# Patient Record
Sex: Male | Born: 1960 | Race: White | Hispanic: No | Marital: Married | Smoking: Never smoker
Health system: Southern US, Community
[De-identification: ages and names within clinical notes are randomized; demographics above are authoritative.]

## PROBLEM LIST (undated history)

## (undated) DIAGNOSIS — H02409 Unspecified ptosis of unspecified eyelid: Secondary | ICD-10-CM

## (undated) DIAGNOSIS — H532 Diplopia: Secondary | ICD-10-CM

## (undated) DIAGNOSIS — R519 Headache, unspecified: Secondary | ICD-10-CM

## (undated) DIAGNOSIS — E669 Obesity, unspecified: Secondary | ICD-10-CM

## (undated) DIAGNOSIS — E785 Hyperlipidemia, unspecified: Secondary | ICD-10-CM

## (undated) DIAGNOSIS — R51 Headache: Secondary | ICD-10-CM

## (undated) DIAGNOSIS — Z789 Other specified health status: Secondary | ICD-10-CM

## (undated) HISTORY — DX: Unspecified ptosis of unspecified eyelid: H02.409

## (undated) HISTORY — DX: Obesity, unspecified: E66.9

## (undated) HISTORY — DX: Diplopia: H53.2

## (undated) HISTORY — PX: KNEE SURGERY: SHX244

## (undated) HISTORY — DX: Hyperlipidemia, unspecified: E78.5

---

## 2018-06-29 ENCOUNTER — Ambulatory Visit: Payer: Commercial Managed Care - PPO | Admitting: Neurology

## 2018-06-29 ENCOUNTER — Encounter: Payer: Self-pay | Admitting: *Deleted

## 2018-06-29 ENCOUNTER — Encounter: Payer: Self-pay | Admitting: Neurology

## 2018-06-29 ENCOUNTER — Telehealth: Payer: Self-pay | Admitting: Neurology

## 2018-06-29 VITALS — BP 123/74 | HR 97 | Ht 71.0 in | Wt 316.0 lb

## 2018-06-29 DIAGNOSIS — H02402 Unspecified ptosis of left eyelid: Secondary | ICD-10-CM | POA: Diagnosis not present

## 2018-06-29 DIAGNOSIS — M6281 Muscle weakness (generalized): Secondary | ICD-10-CM | POA: Diagnosis not present

## 2018-06-29 DIAGNOSIS — H532 Diplopia: Secondary | ICD-10-CM

## 2018-06-29 MED ORDER — SUMATRIPTAN SUCCINATE 50 MG PO TABS: 50.0000 mg | ORAL_TABLET | ORAL | 6 refills | Status: DC | PRN

## 2018-06-29 MED ORDER — PYRIDOSTIGMINE BROMIDE 60 MG PO TABS: 60.0000 mg | ORAL_TABLET | Freq: Three times a day (TID) | ORAL | 6 refills | Status: DC

## 2018-06-29 NOTE — Progress Notes (Signed)
PATIENT: Scott Solis DOB: [birth year]  Chief Complaint  Patient presents with  . Left eye ptosis/diplopia    Reports left eye ptosis and diplopia for the last two weeks.  Marland Kitchen PCP    Noni Saupe, MD     HISTORICAL  Scott Solis is a 57 year old male, seen in request by his primary care physician Dr. Gwendlyn Deutscher for evaluation of left ptosis, binocular double vision, initial evaluation was on June 29, 2018.  I have reviewed and summarized the referring note from the referring physician.  He had past medical history of obesity, occasionally headache in the past, in April 2019, he began to have headache involving left eye inner corner, shortly afterwards, he began to noticed left ptosis, which has been persistent since, in the beginning, ptosis varies, lasting, worse in the late evening, sometimes to the point of locking left vision.  I personally reviewed CT head without contrast on May 14, MRI of the brain June 23 2018 was normal.  Sinus was clear  In August 2019, in addition to persistent variable degree of left ptosis, he developed binocular double vision, initially it was intermittent, difficulty focusing, lasting for 15 minutes, since October 2019, his double vision become persistent, difficulty moving his left eye to left side.    He was initially treated by ENT, there was no significant abnormality found, was seen by ophthalmologist,I reviewed evaluation by [facility] on June 23, 2018 by Dr.Jennifer Abelina Bachelor, iMovie muscle movement consistent with cranial 6 palsy, esotropia of left eye,   Laboratory evaluations in October 2019, normal CBC hemoglobin of 15.9, CMP, creatinine of 0.9, B12 412, TSH 2.5, LDL 129 A1c 6.1, negative hepatitis C,  REVIEW OF SYSTEMS: Full 14 system review of systems performed and notable only for as above All other review of systems were negative.  ALLERGIES: No Known Allergies  HOME MEDICATIONS: Current Outpatient  Medications  Medication Sig Dispense Refill  . Multiple Vitamin (MULTIVITAMIN) capsule Take 1 capsule by mouth daily.     No current facility-administered medications for this visit.     PAST MEDICAL HISTORY: Past Medical History:  Diagnosis Date  . Diplopia   . Dyslipidemia   . Obesity   . Ptosis    left    PAST SURGICAL HISTORY: Past Surgical History:  Procedure Laterality Date  . KNEE SURGERY Right     FAMILY HISTORY: Family History  Problem Relation Age of Onset  . Healthy Mother   . CAD Father   . Healthy Brother     SOCIAL HISTORY: Social History   Socioeconomic History  . Marital status: Married    Spouse name: Not on file  . Number of children: 2  . Years of education: college  . Highest education level: Bachelor's degree (e.g., BA, AB, BS)  Occupational History  . Occupation: IT  Social Needs  . Financial resource strain: Not on file  . Food insecurity:    Worry: Not on file    Inability: Not on file  . Transportation needs:    Medical: Not on file    Non-medical: Not on file  Tobacco Use  . Smoking status: Never Smoker  . Smokeless tobacco: Never Used  Substance and Sexual Activity  . Alcohol use: Yes    Comment: rare  . Drug use: Never  . Sexual activity: Not on file  Lifestyle  . Physical activity:    Days per week: Not on file    Minutes per session: Not  on file  . Stress: Not on file  Relationships  . Social connections:    Talks on phone: Not on file    Gets together: Not on file    Attends religious service: Not on file    Active member of club or organization: Not on file    Attends meetings of clubs or organizations: Not on file    Relationship status: Not on file  . Intimate partner violence:    Fear of current or ex partner: Not on file    Emotionally abused: Not on file    Physically abused: Not on file    Forced sexual activity: Not on file  Other Topics Concern  . Not on file  Social History Narrative   Lives at  home with his wife.   Right-handed.   No daily caffeine use.     PHYSICAL EXAM   Vitals:   06/29/18 0758  BP: 123/74  Pulse: 97  Weight: (!) 316 lb (143.3 kg)  Height: 5\' 11"  (1.803 m)    Not recorded      Body mass index is 44.07 kg/m.  PHYSICAL EXAMNIATION:  Gen: NAD, conversant, well nourised, obese, well groomed                     Cardiovascular: Regular rate rhythm, no peripheral edema, warm, nontender. Eyes: Conjunctivae clear without exudates or hemorrhage Neck: Supple, no carotid bruits. Pulmonary: Clear to auscultation bilaterally   NEUROLOGICAL EXAM:  MENTAL STATUS: Speech:    Speech is normal; fluent and spontaneous with normal comprehension.  Cognition:     Orientation to time, place and person     Normal recent and remote memory     Normal Attention span and concentration     Normal Language, naming, repeating,spontaneous speech     Fund of knowledge   CRANIAL NERVES: CN II: Visual fields are full to confrontation. Fundoscopic exam is normal with sharp discs and no vascular changes. Pupils are round equal and briskly reactive to light. CN III, IV, VI: fatigable left ptosis, left lateral rectus muscle weakness, bilateral esophoria,   CN V: Facial sensation is intact to pinprick in all 3 divisions bilaterally. Corneal responses are intact.  CN VII: Face is symmetric with normal eye closure and smile. No significant bulbar weakness noticed. CN VIII: Hearing is normal to rubbing fingers CN IX, X: Palate elevates symmetrically. Phonation is normal. CN XI: Head turning and shoulder shrug are intact CN XII: Tongue is midline with normal movements and no atrophy.  MOTOR: Mild bilateral shoulder abduction weakness.  REFLEXES: Reflexes are 2+ and symmetric at the biceps, triceps, knees, and ankles. Plantar responses are flexor.  SENSORY: Intact to light touch, pinprick, positional sensation and vibratory sensation are intact in fingers and  toes.  COORDINATION: Rapid alternating movements and fine finger movements are intact. There is no dysmetria on finger-to-nose and heel-knee-shin.    GAIT/STANCE: Able to get up arm crossed. Posture is normal. Gait is steady with normal steps, base, arm swing, and turning. Heel and toe walking are normal. Tandem gait is normal.  Romberg is absent.   DIAGNOSTIC DATA (LABS, IMAGING, TESTING) - I reviewed patient records, labs, notes, testing and imaging myself where available.   ASSESSMENT AND PLAN  Scott Solis is a 57 y.o. male   Left ptosis, binocular double vision,  Most consistent with neuromuscular junctional disorder, myasthenia gravis,  The atypical features is consistent left eye involvement only, persistent left lateral rectus  muscle weakness,  MRA of brain to rule out aneurysm  Myasthenia gravis panel  Mestinon 60 mg 3 times a day  CT of chest to rule out sinus pathology  Headache with migraine features  Imitrex 50 mg as needed for headaches   Levert Feinstein, M.D. Ph.D.  [facility] [address], [address] [place], [place] [address] Ph: [phone] Fax: [phone]  CC: Referring Provider

## 2018-06-29 NOTE — Patient Instructions (Signed)
Myasthenia Gravis  Myasthenia gravis (MG) means severe weakness. It is a long-term (chronic) condition that causes weakness in the muscles you can control (voluntary muscles). MG can affect any voluntary muscle. The muscles most often affected are the ones that control:   Eye movement.   Facial movements.   Swallowing.    MG is an autoimmune disease, which means that your body's defense system (immune system) attacks healthy parts of your body instead of germs and other things that make you sick. When you have MG, your immune system makes proteins (antibodies) that block the chemical (acetylcholine) your body needs to send nerve signals to your muscles. This causes muscle weakness.  What are the causes?  The exact cause of MG is unknown. One possible cause is an enlarged thymus gland, which is located under your breastbone.  What are the signs or symptoms?  The earliest symptom of MG is muscle weakness that gets worse with activity and gets better after rest. Other symptoms of MG may include:   Drooping eyelids.   Double vision.   Loss of facial expression.   Trouble chewing and swallowing.   Slurred speech.   A waddling walk.   Weakness of the arms, hands, and legs.    Trouble breathing is the most dangerous symptom of MG. Sudden and severe difficulty breathing (myasthenic crisis) may require emergency breathing support. This symptom sometimes happens after:   Infection.   Fever.   Drug reaction.    How is this diagnosed?  It can be hard to diagnose MG because muscle weakness is a common symptom in many conditions. Your health care provider will do a physical exam. You may also have tests that will help make a diagnosis. These may include:   A blood test.   A test using the medicine edrophonium. This medicine increases muscle strength by slowing the breakdown of acetylcholine.   Tests to measure nerve conduction to muscle (electromyography).   An imaging study of the chest (CT or MRI).    How is  this treated?  Treatment can improve muscle strength. Sometimes symptoms of MG go away for a while (remission) and you can stop treatment. Possible treatments include:   Medicine.   Removal of the thymus gland (thymectomy). This may result in a long remission for some people.    Follow these instructions at home:   Take medicines only as directed by your health care provider.   Get plenty of rest to conserve your energy.   Take frequent breaks to rest your eyes.   Maintain a healthy diet and a healthy weight.   Do not use any tobacco products including cigarettes, chewing tobacco, or electronic cigarettes. If you need help quitting, ask your health care provider.   Keep all follow-up visits as directed by your health care provider. This is important.  Contact a health care provider if:   Your symptoms get worse after a fever or infection.   You have a reaction to a medicine you are taking.   Your symptoms change or get worse.  Get help right away if:  You have trouble breathing.  This information is not intended to replace advice given to you by your health care provider. Make sure you discuss any questions you have with your health care provider.  Document Released: 12/01/2000 Document Revised: 01/31/2016 Document Reviewed: 10/26/2013  Elsevier Interactive Patient Education  2018 Elsevier Inc.

## 2018-06-29 NOTE — Telephone Encounter (Signed)
[facility] Auth: NPR Ref # [id number] lvm for pt to call me back to see if he would like to go to [place].

## 2018-06-30 NOTE — Telephone Encounter (Signed)
Patient is schedule to have his CT at the out pt center for 07/05/18 arrival time is 1:45 pm once he is done w/that to go to the MRI center at 2:45 pm to have the MRA. Patient is aware of all of this. Faxed orders.

## 2018-07-02 ENCOUNTER — Encounter: Payer: Self-pay | Admitting: Neurology

## 2018-07-05 ENCOUNTER — Encounter: Payer: Self-pay | Admitting: Neurology

## 2018-07-07 ENCOUNTER — Telehealth: Payer: Self-pay | Admitting: Neurology

## 2018-07-07 DIAGNOSIS — I671 Cerebral aneurysm, nonruptured: Secondary | ICD-10-CM

## 2018-07-07 LAB — ACETYLCHOLINE RECEPTOR AB, ALL: ACETYLCHOL BLOCK AB: 20 % (ref 0–25)

## 2018-07-07 LAB — HEMOGLOBIN A1C
ESTIMATED AVERAGE GLUCOSE: 114 mg/dL
HEMOGLOBIN A1C: 5.6 % (ref 4.8–5.6)

## 2018-07-07 NOTE — Addendum Note (Signed)
Addended by: Levert Feinstein on: 07/07/2018 10:07 AM   Modules accepted: Orders

## 2018-07-07 NOTE — Telephone Encounter (Signed)
Sent via Fax [phone] .  Patient is being seen today but Dr. Conchita Paris arrive at 12:15 for a 12:30 apt. I have talked to patient about details and he will be there. Thanks Annabelle Harman.

## 2018-07-07 NOTE — Telephone Encounter (Addendum)
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  He continue complains of left-sided headaches, ptosis, double vision  I have suggested him to stop taking Mestinon, Imitrex, avoiding antiplatelet, NSAIDs,  I have discussed his case with neurosurgeon Dr. Conchita Paris, will see patient urgently this afternoon.

## 2018-07-12 ENCOUNTER — Other Ambulatory Visit: Payer: Self-pay | Admitting: Neurosurgery

## 2018-07-12 DIAGNOSIS — I671 Cerebral aneurysm, nonruptured: Secondary | ICD-10-CM

## 2018-07-22 ENCOUNTER — Other Ambulatory Visit: Payer: Self-pay | Admitting: Neurosurgery

## 2018-07-26 NOTE — H&P (Signed)
Chief Complaint   Carotid cavernous fistula  HPI   HPI Scott Solis is a 57 y.o. male with a primary complaint of left-sided retro-orbital pain, eyelid drooping, and blurry/double vision.    Symptoms started several months ago initially with just retro-orbital pain.  Eventually   Symptoms progressed and he noticed drooping of his eyelid, double vision and the inability to look to the left with his left eye.   History of chronic tinnitus. 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  He presents today for diagnostic cerebral angiogram.  He is without any concerns.   Patient Active Problem List   Diagnosis Date Noted  . 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 Diplopia 06/29/2018    PMH: Past Medical History:  Diagnosis Date  . Diplopia   . Dyslipidemia   . Obesity   . Ptosis    left    PSH: Past Surgical History:  Procedure Laterality Date  . KNEE SURGERY Right      (Not in a hospital admission)  SH: Social History   Tobacco Use  . Smoking status: Never Smoker  . Smokeless tobacco: Never Used  Substance Use Topics  . Alcohol use: Yes    Comment: rare  . Drug use: Never    MEDS: Prior to Admission medications   Medication Sig Start Date End Date Taking? Authorizing Provider  CALCIUM-MAGNESIUM PO Take 1 tablet by mouth daily.   Yes [provider]  Multiple Vitamin (MULTIVITAMIN) capsule Take 3 capsules by mouth daily.    Yes [provider]  SUMAtriptan (IMITREX) 50 MG tablet Take 1 tablet (50 mg total) by mouth every 2 (two) hours as needed for migraine. May repeat in 2 hours if headache persists or recurs. 06/29/18  Yes Levert Feinstein, MD  pyridostigmine (MESTINON) 60 MG tablet Take 1 tablet (60 mg total) by mouth 3 (three) times daily. Patient not taking: Reported on 07/21/2018 06/29/18   Levert Feinstein, MD    ALLERGY: No Known Allergies  Social History   Tobacco Use  . Smoking status: Never Smoker  . Smokeless  tobacco: Never Used  Substance Use Topics  . Alcohol use: Yes    Comment: rare     Family History  Problem Relation Age of Onset  . Healthy Mother   . CAD Father   . Healthy Brother      ROS   ROS  Exam   There were no vitals filed for this visit. General appearance: WDWN, NAD Eyes: No scleral injection Cardiovascular: Regular rate and rhythm without murmurs, rubs, gallops. No edema or variciosities. Distal pulses normal. Pulmonary: Effort normal, non-labored breathing Musculoskeletal:     Muscle tone upper extremities: Normal    Muscle tone lower extremities: Normal    Motor exam: Upper Extremities Deltoid Bicep Tricep Grip  Right 5/5 5/5 5/5 5/5  Left 5/5 5/5 5/5 5/5   Lower Extremity IP Quad PF DF EHL  Right 5/5 5/5 5/5 5/5 5/5  Left 5/5 5/5 5/5 5/5 5/5   Neurological Mental Status:    - Patient is awake, alert, oriented to person, place, month, year, and situation    - Patient is able to give a clear and coherent history.    - No signs of aphasia or neglect Cranial Nerves    - II: Visual Fields are full. PERRL    - III/IV/VI:  Bilateral partial left ptosis.  Left abducens palsy    - V: Facial sensation is  grossly normal    - VII: Facial movement is symmetric.     - VIII: hearing is intact to voice    - X: Uvula elevates symmetrically    - XI: Shoulder shrug is symmetric.    - XII: tongue is midline without atrophy or fasciculations.  Sensory: Sensation grossly intact to LT Deep Tendon Reflexes    - 2+ and symmetric in the biceps and patellae.  Plantars   - Toes are downgoing bilaterally.  Cerebellar    - FNF and HKS are intact bilaterally   Results - Imaging/Labs   No results found for this or any previous visit (from the past 48 hour(s)).  No results found.  IMAGING: MRA of the brain with and without contrast dated 07/02/2018 was reviewed.  This demonstrates significant flow related hyperintensity within the left cavernous sinus, with significant  enlargement and hyperintensity of the superficial left middle cerebral vein.  Of note, no significantly dilated superior ophthalmic vein on the left.  Impression/Plan   57 y.o. 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  Etiology remains unclear.  We will proceed with diagnostic cerebral angiogram for further workup in characterization of the carotid cavernous fistula.  While in the office risks, benefits and alternatives to the procedure were discussed.  Patient and his wife understood discussion and willing to proceed.  All questions were answered.

## 2018-07-27 ENCOUNTER — Encounter (HOSPITAL_COMMUNITY): Payer: Self-pay

## 2018-07-27 ENCOUNTER — Ambulatory Visit (HOSPITAL_COMMUNITY)
Admission: RE | Admit: 2018-07-27 | Discharge: 2018-07-27 | Disposition: A | Discharge status: Home / Self Care | Payer: Commercial Managed Care - PPO | Source: Ambulatory Visit | Attending: Neurosurgery | Admitting: Neurosurgery

## 2018-07-27 ENCOUNTER — Other Ambulatory Visit: Payer: Self-pay | Admitting: Neurosurgery

## 2018-07-27 DIAGNOSIS — E669 Obesity, unspecified: Secondary | ICD-10-CM | POA: Diagnosis not present

## 2018-07-27 DIAGNOSIS — I671 Cerebral aneurysm, nonruptured: Secondary | ICD-10-CM

## 2018-07-27 DIAGNOSIS — H0520 Unspecified exophthalmos: Secondary | ICD-10-CM | POA: Diagnosis not present

## 2018-07-27 DIAGNOSIS — H532 Diplopia: Secondary | ICD-10-CM | POA: Diagnosis not present

## 2018-07-27 DIAGNOSIS — E785 Hyperlipidemia, unspecified: Secondary | ICD-10-CM | POA: Insufficient documentation

## 2018-07-27 DIAGNOSIS — H02402 Unspecified ptosis of left eyelid: Secondary | ICD-10-CM | POA: Diagnosis not present

## 2018-07-27 HISTORY — PX: IR ANGIO INTRA EXTRACRAN SEL INTERNAL CAROTID BILAT MOD SED: IMG5363

## 2018-07-27 HISTORY — PX: IR ANGIO EXTERNAL CAROTID SEL EXT CAROTID BILAT MOD SED: IMG5372

## 2018-07-27 HISTORY — PX: IR ANGIO VERTEBRAL SEL VERTEBRAL UNI R MOD SED: IMG5368

## 2018-07-27 HISTORY — DX: Other specified health status: Z78.9

## 2018-07-27 LAB — CBC WITH DIFFERENTIAL/PLATELET
Abs Immature Granulocytes: 0.06 10*3/uL (ref 0.00–0.07)
BASOS ABS: 0 10*3/uL (ref 0.0–0.1)
Basophils Relative: 0 %
EOS PCT: 3 %
Eosinophils Absolute: 0.2 10*3/uL (ref 0.0–0.5)
HEMATOCRIT: 50.5 % (ref 39.0–52.0)
HEMOGLOBIN: 15.7 g/dL (ref 13.0–17.0)
Immature Granulocytes: 1 %
LYMPHS PCT: 22 %
Lymphs Abs: 1.1 10*3/uL (ref 0.7–4.0)
MCH: 28.3 pg (ref 26.0–34.0)
MCHC: 31.1 g/dL (ref 30.0–36.0)
MCV: 91.2 fL (ref 80.0–100.0)
Monocytes Absolute: 0.5 10*3/uL (ref 0.1–1.0)
Monocytes Relative: 11 %
NRBC: 0 % (ref 0.0–0.2)
Neutro Abs: 3.2 10*3/uL (ref 1.7–7.7)
Neutrophils Relative %: 63 %
Platelets: 163 10*3/uL (ref 150–400)
RBC: 5.54 MIL/uL (ref 4.22–5.81)
RDW: 12.9 % (ref 11.5–15.5)
WBC: 5.1 10*3/uL (ref 4.0–10.5)

## 2018-07-27 LAB — BASIC METABOLIC PANEL
Anion gap: 9 (ref 5–15)
BUN: 12 mg/dL (ref 6–20)
CALCIUM: 9 mg/dL (ref 8.9–10.3)
CO2: 26 mmol/L (ref 22–32)
CREATININE: 0.94 mg/dL (ref 0.61–1.24)
Chloride: 105 mmol/L (ref 98–111)
GFR calc Af Amer: >60 mL/min (ref >60)
GFR calc non Af Amer: >60 mL/min (ref >60)
Glucose, Bld: 98 mg/dL (ref 70–99)
Potassium: 3.9 mmol/L (ref 3.5–5.1)
SODIUM: 140 mmol/L (ref 135–145)

## 2018-07-27 LAB — APTT: APTT: 28 s (ref 24–36)

## 2018-07-27 LAB — PROTIME-INR
INR: 1
PROTHROMBIN TIME: 13.1 s (ref 11.4–15.2)

## 2018-07-27 MED ORDER — IOPAMIDOL (ISOVUE-300) INJECTION 61%
INTRAVENOUS | Status: AC
  Filled 2018-07-27: qty 100

## 2018-07-27 MED ORDER — MIDAZOLAM HCL 2 MG/2ML IJ SOLN
INTRAMUSCULAR | Status: AC
  Filled 2018-07-27: qty 2

## 2018-07-27 MED ORDER — HEPARIN SODIUM (PORCINE) 1000 UNIT/ML IJ SOLN
INTRAMUSCULAR | Status: AC | PRN
  Administered 2018-07-27: 2000 [IU] via INTRAVENOUS

## 2018-07-27 MED ORDER — HYDROCODONE-ACETAMINOPHEN 5-325 MG PO TABS: 1.0000 | ORAL_TABLET | ORAL | Status: DC | PRN

## 2018-07-27 MED ORDER — MIDAZOLAM HCL 2 MG/2ML IJ SOLN
INTRAMUSCULAR | Status: AC | PRN
  Administered 2018-07-27: 1 mg via INTRAVENOUS

## 2018-07-27 MED ORDER — LIDOCAINE HCL 1 % IJ SOLN
INTRAMUSCULAR | Status: AC
  Filled 2018-07-27: qty 20

## 2018-07-27 MED ORDER — LIDOCAINE HCL (PF) 1 % IJ SOLN
INTRAMUSCULAR | Status: AC | PRN
  Administered 2018-07-27: 10 mL

## 2018-07-27 MED ORDER — SODIUM CHLORIDE 0.9 % IV SOLN: INTRAVENOUS | Status: DC

## 2018-07-27 MED ORDER — FENTANYL CITRATE (PF) 100 MCG/2ML IJ SOLN
INTRAMUSCULAR | Status: AC
  Filled 2018-07-27: qty 2

## 2018-07-27 MED ORDER — IOPAMIDOL (ISOVUE-300) INJECTION 61%
INTRAVENOUS | Status: AC
  Filled 2018-07-27: qty 50

## 2018-07-27 MED ORDER — HEPARIN SODIUM (PORCINE) 1000 UNIT/ML IJ SOLN
INTRAMUSCULAR | Status: AC
  Filled 2018-07-27: qty 1

## 2018-07-27 MED ORDER — FENTANYL CITRATE (PF) 100 MCG/2ML IJ SOLN
INTRAMUSCULAR | Status: AC | PRN
  Administered 2018-07-27: 25 ug via INTRAVENOUS

## 2018-07-27 NOTE — Discharge Instructions (Signed)
Cerebral Angiogram, Care After °Refer to this sheet in the next few weeks. These instructions provide you with information on caring for yourself after your procedure. Your health care provider may also give you more specific instructions. Your treatment has been planned according to current medical practices, but problems sometimes occur. Call your health care provider if you have any problems or questions after your procedure. °What can I expect after the procedure? °After your procedure, it is typical to have the following: °· Bruising at the catheter insertion site that usually fades within 1-2 weeks. °· Blood collecting in the tissue (hematoma) that may be painful to the touch. It should usually decrease in size and tenderness within 1-2 weeks. °· A mild headache. ° °Follow these instructions at home: °· Take medicines only as directed by your health care provider. °· You may shower 24-48 hours after the procedure or as directed by your health care provider. Remove the bandage (dressing) and gently wash the site with plain soap and water. Pat the area dry with a clean towel. Do not rub the site, because this may cause bleeding. °· Do not take baths, swim, or use a hot tub until your health care provider approves. °· Check your insertion site every day for redness, swelling, or drainage. °· Do not apply powder or lotion to the site. °· Do not lift over 10 lb (4.5 kg) for 5 days after your procedure or as directed by your health care provider. °· Ask your health care provider when it is okay to: °? Return to work or school. °? Resume usual physical activities or sports. °? Resume sexual activity. °· Do not drive home if you are discharged the same day as the procedure. Have someone else drive you. °· You may drive 24 hours after the procedure unless otherwise instructed by your health care provider. °· Do not operate machinery or power tools for 24 hours after the procedure or as directed by your health care  provider. °· If your procedure was done as an outpatient procedure, which means that you went home the same day as your procedure, a responsible adult should be with you for the first 24 hours after you arrive home. °· Keep all follow-up visits as directed by your health care provider. This is important. °Contact a health care provider if: °· You have a fever. °· You have chills. °· You have increased bleeding from the catheter insertion site. Hold pressure on the site. CALL 911 °Get help right away if: °· You have vision changes or loss of vision. °· You have numbness or weakness on one side of your body. °· You have difficulty talking, or you have slurred speech or cannot speak (aphasia). °· You feel confused or have difficulty remembering. °· You have unusual pain at the catheter insertion site. °· You have redness, warmth, or swelling at the catheter insertion site. °· You have drainage (other than a small amount of blood on the dressing) from the catheter insertion site. °· The catheter insertion site is bleeding, and the bleeding does not stop after 30 minutes of holding steady pressure on the site. °These symptoms may represent a serious problem that is an emergency. Do not wait to see if the symptoms will go away. Get medical help right away. Call your local emergency services (911 in [place] Do not drive yourself to the hospital. °This information is not intended to replace advice given to you by your health care provider. Make sure you discuss   any questions you have with your health care provider. °Document Released: 01/09/2014 Document Revised: 01/31/2016 Document Reviewed: 09/07/2013 °Elsevier Interactive Patient Education © 2017 [employer] Inc. °Moderate Conscious Sedation, Adult, Care After °These instructions provide you with information about caring for yourself after your procedure. Your health care provider may also give you more specific instructions. Your treatment has been planned according to  current medical practices, but problems sometimes occur. Call your health care provider if you have any problems or questions after your procedure. °What can I expect after the procedure? °After your procedure, it is common: °· To feel sleepy for several hours. °· To feel clumsy and have poor balance for several hours. °· To have poor judgment for several hours. °· To vomit if you eat too soon. ° °Follow these instructions at home: °For at least 24 hours after the procedure: ° °· Do not: °? Participate in activities where you could fall or become injured. °? Drive. °? Use heavy machinery. °? Drink alcohol. °? Take sleeping pills or medicines that cause drowsiness. °? Make important decisions or sign legal documents. °? Take care of children on your own. °· Rest. °Eating and drinking °· Follow the diet recommended by your health care provider. °· If you vomit: °? Drink water, juice, or soup when you can drink without vomiting. °? Make sure you have little or no nausea before eating solid foods. °General instructions °· Have a responsible adult stay with you until you are awake and alert. °· Take over-the-counter and prescription medicines only as told by your health care provider. °· If you smoke, do not smoke without supervision. °· Keep all follow-up visits as told by your health care provider. This is important. °Contact a health care provider if: °· You keep feeling nauseous or you keep vomiting. °· You feel light-headed. °· You develop a rash. °· You have a fever. °Get help right away if: °· You have trouble breathing. °This information is not intended to replace advice given to you by your health care provider. Make sure you discuss any questions you have with your health care provider. °Document Released: 06/15/2013 Document Revised: 01/28/2016 Document Reviewed: 12/15/2015 °Elsevier Interactive Patient Education © 2018 Elsevier Inc. ° °

## 2018-07-27 NOTE — Sedation Documentation (Signed)
Right groin arterial sheath removed. 775fr Exoseal closure device used, manual pressure being held at right groin site.

## 2018-07-29 ENCOUNTER — Ambulatory Visit: Payer: Commercial Managed Care - PPO | Admitting: Neurology

## 2018-08-03 ENCOUNTER — Encounter (HOSPITAL_COMMUNITY): Payer: Self-pay | Admitting: Neurosurgery

## 2018-08-10 ENCOUNTER — Other Ambulatory Visit (HOSPITAL_COMMUNITY): Payer: Self-pay | Admitting: Interventional Radiology

## 2018-08-10 ENCOUNTER — Other Ambulatory Visit (HOSPITAL_COMMUNITY): Payer: Self-pay | Admitting: Neurosurgery

## 2018-08-10 ENCOUNTER — Other Ambulatory Visit: Payer: Self-pay | Admitting: Neurosurgery

## 2018-08-10 DIAGNOSIS — I671 Cerebral aneurysm, nonruptured: Secondary | ICD-10-CM

## 2018-08-17 NOTE — Pre-Procedure Instructions (Signed)
Scott Solis  08/17/2018      [id number] Rosalita Levan, [place] - [address] [place] [address] Phone: [phone] Fax: [phone]    Your procedure is scheduled on Friday December 13th.  Report to [facility] Admitting at 9:30 A.M.  Call this number if you have problems the morning of surgery:  [phone]   Remember:  Do not eat or drink after midnight.     Take these medicines the morning of surgery with A SIP OF WATER - NONE    7 days prior to surgery STOP taking any Aspirin(unless otherwise instructed by your surgeon), Aleve, Naproxen, Ibuprofen, Motrin, Advil, Goody's, BC's, all herbal medications, fish oil, and all vitamins   Do not wear jewelry.  Do not wear lotions, powders, or colognes, or deodorant.  Do not shave 48 hours prior to surgery.  Men may shave face and neck.  Do not bring valuables to the hospital.  [facility] is not responsible for any belongings or valuables.  Contacts, eyeglasses, hearing aids, dentures or bridgework may not be worn into surgery.  Leave your suitcase in the car.  After surgery it may be brought to your room.  For patients admitted to the hospital, discharge time will be determined by your treatment team.  Patients discharged the day of surgery will not be allowed to drive home.   [facility]- Preparing For Surgery  Before surgery, you can play an important role. Because skin is not sterile, your skin needs to be as free of germs as possible. You can reduce the number of germs on your skin by washing with CHG (chlorahexidine gluconate) Soap before surgery.  CHG is an antiseptic cleaner which kills germs and bonds with the skin to continue killing germs even after washing.    Oral Hygiene is also important to reduce your risk of infection.  Remember - BRUSH YOUR TEETH THE MORNING OF SURGERY WITH YOUR REGULAR TOOTHPASTE  Please do not use if you have an allergy to CHG or  antibacterial soaps. If your skin becomes reddened/irritated stop using the CHG.  Do not shave (including legs and underarms) for at least 48 hours prior to first CHG shower. It is OK to shave your face.  Please follow these instructions carefully.   1. Shower the NIGHT BEFORE SURGERY and the MORNING OF SURGERY with CHG.   2. If you chose to wash your hair, wash your hair first as usual with your normal shampoo.  3. After you shampoo, rinse your hair and body thoroughly to remove the shampoo.  4. Use CHG as you would any other liquid soap. You can apply CHG directly to the skin and wash gently with a scrungie or a clean washcloth.   5. Apply the CHG Soap to your body ONLY FROM THE NECK DOWN.  Do not use on open wounds or open sores. Avoid contact with your eyes, ears, mouth and genitals (private parts). Wash Face and genitals (private parts)  with your normal soap.  6. Wash thoroughly, paying special attention to the area where your surgery will be performed.  7. Thoroughly rinse your body with warm water from the neck down.  8. DO NOT shower/wash with your normal soap after using and rinsing off the CHG Soap.  9. Pat yourself dry with a CLEAN TOWEL.  10. Wear CLEAN PAJAMAS to bed the night before surgery, wear comfortable clothes the morning of surgery  11. Place CLEAN  SHEETS on your bed the night of your first shower and DO NOT SLEEP WITH PETS.    Day of Surgery: Shower as stated above. Do not apply any deodorants/lotions.  Please wear clean clothes to the hospital/surgery center.   Remember to brush your teeth WITH YOUR REGULAR TOOTHPASTE.

## 2018-08-18 ENCOUNTER — Encounter (HOSPITAL_COMMUNITY): Payer: Self-pay

## 2018-08-18 ENCOUNTER — Other Ambulatory Visit: Payer: Self-pay

## 2018-08-18 ENCOUNTER — Encounter (HOSPITAL_COMMUNITY)
Admission: RE | Admit: 2018-08-18 | Discharge: 2018-08-18 | Disposition: A | Discharge status: Home / Self Care | Payer: Commercial Managed Care - PPO | Source: Ambulatory Visit | Attending: Neurosurgery | Admitting: Neurosurgery

## 2018-08-18 DIAGNOSIS — I671 Cerebral aneurysm, nonruptured: Secondary | ICD-10-CM | POA: Insufficient documentation

## 2018-08-18 DIAGNOSIS — Z01818 Encounter for other preprocedural examination: Secondary | ICD-10-CM

## 2018-08-18 HISTORY — DX: Headache, unspecified: R51.9

## 2018-08-18 HISTORY — DX: Headache: R51

## 2018-08-18 LAB — CBC WITH DIFFERENTIAL/PLATELET
ABS IMMATURE GRANULOCYTES: 0.05 10*3/uL (ref 0.00–0.07)
Basophils Absolute: 0 10*3/uL (ref 0.0–0.1)
Basophils Relative: 1 %
Eosinophils Absolute: 0.2 10*3/uL (ref 0.0–0.5)
Eosinophils Relative: 3 %
HCT: 48.5 % (ref 39.0–52.0)
Hemoglobin: 15.5 g/dL (ref 13.0–17.0)
Immature Granulocytes: 1 %
Lymphocytes Relative: 21 %
Lymphs Abs: 1.2 10*3/uL (ref 0.7–4.0)
MCH: 29.2 pg (ref 26.0–34.0)
MCHC: 32 g/dL (ref 30.0–36.0)
MCV: 91.3 fL (ref 80.0–100.0)
Monocytes Absolute: 0.6 10*3/uL (ref 0.1–1.0)
Monocytes Relative: 11 %
NEUTROS ABS: 3.8 10*3/uL (ref 1.7–7.7)
NRBC: 0 % (ref 0.0–0.2)
Neutrophils Relative %: 63 %
Platelets: 161 10*3/uL (ref 150–400)
RBC: 5.31 MIL/uL (ref 4.22–5.81)
RDW: 13 % (ref 11.5–15.5)
WBC: 5.9 10*3/uL (ref 4.0–10.5)

## 2018-08-18 LAB — BASIC METABOLIC PANEL
Anion gap: 10 (ref 5–15)
BUN: 12 mg/dL (ref 6–20)
CO2: 27 mmol/L (ref 22–32)
Calcium: 9.1 mg/dL (ref 8.9–10.3)
Chloride: 104 mmol/L (ref 98–111)
Creatinine, Ser: 0.84 mg/dL (ref 0.61–1.24)
GFR calc Af Amer: >60 mL/min (ref >60)
GFR calc non Af Amer: >60 mL/min (ref >60)
Glucose, Bld: 100 mg/dL — ABNORMAL HIGH (ref 70–99)
Potassium: 4.2 mmol/L (ref 3.5–5.1)
Sodium: 141 mmol/L (ref 135–145)

## 2018-08-18 LAB — URINALYSIS, ROUTINE W REFLEX MICROSCOPIC
Bilirubin Urine: NEGATIVE
Glucose, UA: NEGATIVE mg/dL
Hgb urine dipstick: NEGATIVE
Ketones, ur: NEGATIVE mg/dL
Leukocytes, UA: NEGATIVE
Nitrite: NEGATIVE
PROTEIN: NEGATIVE mg/dL
Specific Gravity, Urine: 1.011 (ref 1.005–1.030)
pH: 5 (ref 5.0–8.0)

## 2018-08-18 LAB — PROTIME-INR
INR: 1.03
Prothrombin Time: 13.4 seconds (ref 11.4–15.2)

## 2018-08-18 LAB — SURGICAL PCR SCREEN
MRSA, PCR: NEGATIVE
Staphylococcus aureus: POSITIVE — AB

## 2018-08-18 LAB — APTT: aPTT: 28 seconds (ref 24–36)

## 2018-08-18 NOTE — H&P (Signed)
Chief Complaint   Carotid fistula  HPI   HPI: Scott Solis is a 57 y.o. male with a primary complaint of left-sided retro-orbital pain, eyelid drooping, and blurry/double vision.    Symptoms started several months ago initially with just retro-orbital pain.  Eventually   Symptoms progressed and he noticed drooping of his eyelid, double vision and the inability to look to the left with his left eye.   History of chronic tinnitus. 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  He presents today for endovascular treatment.  He is without any concerns.  Patient Active Problem List   Diagnosis Date Noted  . 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 Diplopia 06/29/2018    PMH: Past Medical History:  Diagnosis Date  . Diplopia   . Dyslipidemia   . Medical history non-contributory   . Obesity   . Ptosis    left    PSH: Past Surgical History:  Procedure Laterality Date  . IR ANGIO EXTERNAL CAROTID SEL EXT CAROTID BILAT MOD SED  07/27/2018  . IR ANGIO INTRA EXTRACRAN SEL INTERNAL CAROTID BILAT MOD SED  07/27/2018  . IR ANGIO VERTEBRAL SEL VERTEBRAL UNI R MOD SED  07/27/2018  . KNEE SURGERY Right     No medications prior to admission.    SH: Social History   Tobacco Use  . Smoking status: Never Smoker  . Smokeless tobacco: Never Used  Substance Use Topics  . Alcohol use: Yes    Comment: rare  . Drug use: Never    MEDS: Prior to Admission medications   Medication Sig Start Date End Date Taking? Authorizing Provider  CALCIUM-MAGNESIUM PO Take 1 tablet by mouth daily.   Yes [provider]  Multiple Vitamin (MULTIVITAMIN) capsule Take 1 capsule by mouth daily.    Yes [provider]  SUMAtriptan (IMITREX) 50 MG tablet Take 1 tablet (50 mg total) by mouth every 2 (two) hours as needed for migraine. May repeat in 2 hours if headache persists or  recurs. 06/29/18  Yes Levert Feinstein, MD  pyridostigmine (MESTINON) 60 MG tablet Take 1 tablet (60 mg total) by mouth 3 (three) times daily. Patient not taking: Reported on 07/21/2018 06/29/18   Levert Feinstein, MD    ALLERGY: No Known Allergies  Social History   Tobacco Use  . Smoking status: Never Smoker  . Smokeless tobacco: Never Used  Substance Use Topics  . Alcohol use: Yes    Comment: rare     Family History  Problem Relation Age of Onset  . Healthy Mother   . CAD Father   . Healthy Brother      ROS   ROS  Exam   There were no vitals filed for this visit. General appearance: WDWN, NAD Eyes: No scleral injection Cardiovascular: Regular rate and rhythm without murmurs, rubs, gallops. No edema or variciosities. Distal pulses normal. Pulmonary: Effort normal, non-labored breathing Musculoskeletal:     Muscle tone upper extremities: Normal    Muscle tone lower extremities: Normal    Motor exam: Upper Extremities Deltoid Bicep Tricep Grip  Right 5/5 5/5 5/5 5/5  Left 5/5 5/5 5/5 5/5   Lower Extremity IP Quad PF DF EHL  Right 5/5 5/5 5/5 5/5 5/5  Left 5/5 5/5 5/5 5/5 5/5   Neurological Mental Status:    - Patient is awake, alert, oriented to person, place, month, year, and situation    -  Patient is able to give a clear and coherent history.    - No signs of aphasia or neglect Cranial Nerves    - II: Visual Fields are full. PERRL    - III/IV/VI: EOMI   Partial left ptosis, left abducens palsy    - V: Facial sensation is grossly normal    - VII: Facial movement is symmetric.     - VIII: hearing is intact to voice    - X: Uvula elevates symmetrically    - XI: Shoulder shrug is symmetric.    - XII: tongue is midline without atrophy or fasciculations.  Sensory: Sensation grossly intact to LT Deep Tendon Reflexes    - 2+ and symmetric in the biceps and patellae.  Plantars   - Toes are downgoing bilaterally.  Cerebellar    - FNF and HKS are intact  bilaterally   Results - Imaging/Labs   No results found for this or any previous visit (from the past 48 hour(s)).  No results found.  IMAGING: Diagnostic cerebral angiogram was again reviewed which demonstrates the presence of a type D in direct carotid cavernous fistula supplied by bilateral internal carotid arteries, and bilateral external carotid arteries.  Venous drainage is primarily through a left temporal cortical vein, with no significant opacification of the superior ophthalmic vein.    Impression/Plan   57 y.o. 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 He presents today for transvenous embolization of the left carotid-cavernous fistula.    While in the office risks, benefits and alternatives to the procedure were discussed.  Patient stated an own language understanding and wished to proceed.

## 2018-08-18 NOTE — Progress Notes (Signed)
PCP - Dr. Gwendlyn DeutscherJohn Redding  Cardiologist - Denies  Chest x-ray - Denies  EKG - Denies  Stress Test - Denies  ECHO - Denies  Cardiac Cath - Denies  AICD- na PM- na LOOP- na  Sleep Study - Denies CPAP - None  LABS- 08/18/18: CBC w/D, BMP, PT, PTT, UA, PCR   ASA- Denies   Anesthesia- No  Pt denies having chest pain, sob, or fever at this time. All instructions explained to the pt, with a verbal understanding of the material. Pt agrees to go over the instructions while at home for a better understanding. The opportunity to ask questions was provided.

## 2018-08-19 ENCOUNTER — Other Ambulatory Visit: Payer: Self-pay | Admitting: Neurosurgery

## 2018-08-19 MED ORDER — DEXTROSE 5 % IV SOLN
3.0000 g | INTRAVENOUS | Status: AC
  Administered 2018-08-20: 3 g via INTRAVENOUS
  Filled 2018-08-19: qty 3

## 2018-08-20 ENCOUNTER — Inpatient Hospital Stay (HOSPITAL_COMMUNITY)
Admission: RE | Admit: 2018-08-20 | Discharge: 2018-08-21 | DRG: 271 | Disposition: A | Discharge status: Home / Self Care | Payer: Commercial Managed Care - PPO | Attending: Neurosurgery | Admitting: Neurosurgery

## 2018-08-20 ENCOUNTER — Encounter (HOSPITAL_COMMUNITY): Payer: Self-pay | Admitting: Certified Registered Nurse Anesthetist

## 2018-08-20 ENCOUNTER — Inpatient Hospital Stay (HOSPITAL_COMMUNITY): Payer: Commercial Managed Care - PPO | Admitting: Certified Registered Nurse Anesthetist

## 2018-08-20 ENCOUNTER — Ambulatory Visit (HOSPITAL_COMMUNITY)
Admission: RE | Admit: 2018-08-20 | Discharge: 2018-08-20 | Disposition: A | Discharge status: Home / Self Care | Payer: Commercial Managed Care - PPO | Source: Ambulatory Visit | Attending: Neurosurgery | Admitting: Neurosurgery

## 2018-08-20 ENCOUNTER — Encounter (HOSPITAL_COMMUNITY)
Admission: RE | Disposition: A | Discharge status: Home / Self Care | Payer: Self-pay | Source: Home / Self Care | Attending: Neurosurgery

## 2018-08-20 DIAGNOSIS — I772 Rupture of artery: Secondary | ICD-10-CM | POA: Diagnosis present

## 2018-08-20 DIAGNOSIS — H532 Diplopia: Secondary | ICD-10-CM | POA: Diagnosis present

## 2018-08-20 DIAGNOSIS — Z6841 Body Mass Index (BMI) 40.0 and over, adult: Secondary | ICD-10-CM | POA: Diagnosis not present

## 2018-08-20 DIAGNOSIS — H02402 Unspecified ptosis of left eyelid: Secondary | ICD-10-CM | POA: Insufficient documentation

## 2018-08-20 DIAGNOSIS — I671 Cerebral aneurysm, nonruptured: Secondary | ICD-10-CM

## 2018-08-20 DIAGNOSIS — H9319 Tinnitus, unspecified ear: Secondary | ICD-10-CM | POA: Diagnosis present

## 2018-08-20 DIAGNOSIS — E785 Hyperlipidemia, unspecified: Secondary | ICD-10-CM | POA: Diagnosis present

## 2018-08-20 HISTORY — PX: IR ANGIO INTRA EXTRACRAN SEL COM CAROTID INNOMINATE BILAT MOD SED: IMG5360

## 2018-08-20 HISTORY — PX: IR TRANSCATH/EMBOLIZ: IMG695

## 2018-08-20 HISTORY — PX: IR VENO SAGITTAL SINUS: IMG687

## 2018-08-20 HISTORY — PX: RADIOLOGY WITH ANESTHESIA: SHX6223

## 2018-08-20 HISTORY — PX: IR ANGIOGRAM FOLLOW UP STUDY: IMG697

## 2018-08-20 HISTORY — PX: IR ANGIO EXTERNAL CAROTID SEL EXT CAROTID UNI L MOD SED: IMG5370

## 2018-08-20 HISTORY — PX: IR US GUIDE VASC ACCESS RIGHT: IMG2390

## 2018-08-20 SURGERY — IR WITH ANESTHESIA
Anesthesia: General | Laterality: Left

## 2018-08-20 MED ORDER — FENTANYL CITRATE (PF) 250 MCG/5ML IJ SOLN
INTRAMUSCULAR | Status: DC | PRN
  Administered 2018-08-20: 50 ug via INTRAVENOUS
  Administered 2018-08-20: 100 ug via INTRAVENOUS

## 2018-08-20 MED ORDER — ONDANSETRON HCL 4 MG/2ML IJ SOLN: 4.0000 mg | INTRAMUSCULAR | Status: DC | PRN

## 2018-08-20 MED ORDER — LACTATED RINGERS IV SOLN
INTRAVENOUS | Status: DC | PRN
  Administered 2018-08-20: 12:00:00 via INTRAVENOUS

## 2018-08-20 MED ORDER — SUGAMMADEX SODIUM 200 MG/2ML IV SOLN
INTRAVENOUS | Status: DC | PRN
  Administered 2018-08-20: 269.4 mg via INTRAVENOUS

## 2018-08-20 MED ORDER — ADULT MULTIVITAMIN W/MINERALS CH: 1.0000 | ORAL_TABLET | Freq: Every day | ORAL | Status: DC

## 2018-08-20 MED ORDER — LIDOCAINE 2% (20 MG/ML) 5 ML SYRINGE
INTRAMUSCULAR | Status: DC | PRN
  Administered 2018-08-20: 60 mg via INTRAVENOUS

## 2018-08-20 MED ORDER — HYDROMORPHONE HCL 1 MG/ML IJ SOLN: 0.2500 mg | INTRAMUSCULAR | Status: DC | PRN

## 2018-08-20 MED ORDER — CHLORHEXIDINE GLUCONATE CLOTH 2 % EX PADS: 6.0000 | MEDICATED_PAD | Freq: Once | CUTANEOUS | Status: DC

## 2018-08-20 MED ORDER — GLYCOPYRROLATE 0.2 MG/ML IJ SOLN
INTRAMUSCULAR | Status: DC | PRN
  Administered 2018-08-20 (×2): .1 mg via INTRAVENOUS

## 2018-08-20 MED ORDER — PHENYLEPHRINE 40 MCG/ML (10ML) SYRINGE FOR IV PUSH (FOR BLOOD PRESSURE SUPPORT)
PREFILLED_SYRINGE | INTRAVENOUS | Status: DC | PRN
  Administered 2018-08-20: 80 ug via INTRAVENOUS

## 2018-08-20 MED ORDER — LABETALOL HCL 5 MG/ML IV SOLN
INTRAVENOUS | Status: AC
  Filled 2018-08-20: qty 4

## 2018-08-20 MED ORDER — SODIUM CHLORIDE 0.9 % IV SOLN
INTRAVENOUS | Status: DC | PRN
  Administered 2018-08-20: 15 ug/min via INTRAVENOUS

## 2018-08-20 MED ORDER — MORPHINE SULFATE (PF) 2 MG/ML IV SOLN: 1.0000 mg | INTRAVENOUS | Status: DC | PRN

## 2018-08-20 MED ORDER — ROCURONIUM BROMIDE 10 MG/ML (PF) SYRINGE
PREFILLED_SYRINGE | INTRAVENOUS | Status: DC | PRN
  Administered 2018-08-20: 20 mg via INTRAVENOUS
  Administered 2018-08-20: 10 mg via INTRAVENOUS
  Administered 2018-08-20 (×2): 50 mg via INTRAVENOUS
  Administered 2018-08-20: 20 mg via INTRAVENOUS

## 2018-08-20 MED ORDER — ONDANSETRON HCL 4 MG/2ML IJ SOLN
INTRAMUSCULAR | Status: DC | PRN
  Administered 2018-08-20: 4 mg via INTRAVENOUS

## 2018-08-20 MED ORDER — PROPOFOL 10 MG/ML IV BOLUS
INTRAVENOUS | Status: DC | PRN
  Administered 2018-08-20: 200 mg via INTRAVENOUS

## 2018-08-20 MED ORDER — NITROGLYCERIN 1 MG/10 ML FOR IR/CATH LAB
INTRA_ARTERIAL | Status: AC
  Filled 2018-08-20: qty 10

## 2018-08-20 MED ORDER — IOHEXOL 300 MG/ML  SOLN: 300.0000 mL | Freq: Once | INTRAMUSCULAR | Status: DC | PRN

## 2018-08-20 MED ORDER — OXYCODONE HCL 5 MG PO TABS: 5.0000 mg | ORAL_TABLET | Freq: Once | ORAL | Status: DC | PRN

## 2018-08-20 MED ORDER — MIDAZOLAM HCL 2 MG/2ML IJ SOLN
INTRAMUSCULAR | Status: DC | PRN
Start: 2018-08-20 — End: 2018-08-20
  Administered 2018-08-20: 2 mg via INTRAVENOUS

## 2018-08-20 MED ORDER — DEXAMETHASONE 6 MG PO TABS
6.0000 mg | ORAL_TABLET | Freq: Four times a day (QID) | ORAL | Status: DC
  Administered 2018-08-20 – 2018-08-21 (×2): 6 mg via ORAL
  Filled 2018-08-20 (×2): qty 1

## 2018-08-20 MED ORDER — OXYCODONE HCL 5 MG/5ML PO SOLN: 5.0000 mg | Freq: Once | ORAL | Status: DC | PRN

## 2018-08-20 MED ORDER — DEXAMETHASONE 4 MG PO TABS: 4.0000 mg | ORAL_TABLET | Freq: Four times a day (QID) | ORAL | Status: DC

## 2018-08-20 MED ORDER — LIDOCAINE HCL 1 % IJ SOLN
INTRAMUSCULAR | Status: AC
  Filled 2018-08-20: qty 20

## 2018-08-20 MED ORDER — DEXAMETHASONE 4 MG PO TABS: 4.0000 mg | ORAL_TABLET | Freq: Three times a day (TID) | ORAL | Status: DC

## 2018-08-20 MED ORDER — LABETALOL HCL 5 MG/ML IV SOLN
10.0000 mg | INTRAVENOUS | Status: DC | PRN
  Administered 2018-08-20 (×6): 10 mg via INTRAVENOUS
  Filled 2018-08-20: qty 4

## 2018-08-20 MED ORDER — PROMETHAZINE HCL 25 MG/ML IJ SOLN: 6.2500 mg | INTRAMUSCULAR | Status: DC | PRN

## 2018-08-20 MED ORDER — ONDANSETRON HCL 4 MG PO TABS: 4.0000 mg | ORAL_TABLET | ORAL | Status: DC | PRN

## 2018-08-20 MED ORDER — VERAPAMIL HCL 2.5 MG/ML IV SOLN
INTRAVENOUS | Status: AC
  Administered 2018-08-20: 2.5 mg via INTRA_ARTERIAL
  Filled 2018-08-20: qty 2

## 2018-08-20 MED ORDER — HYDROCODONE-ACETAMINOPHEN 5-325 MG PO TABS
1.0000 | ORAL_TABLET | ORAL | Status: DC | PRN
  Filled 2018-08-20: qty 1

## 2018-08-20 MED ORDER — HEPARIN SODIUM (PORCINE) 1000 UNIT/ML IJ SOLN
INTRAMUSCULAR | Status: AC
  Filled 2018-08-20: qty 1

## 2018-08-20 MED ORDER — NITROGLYCERIN 1 MG/10 ML FOR IR/CATH LAB
INTRA_ARTERIAL | Status: DC | PRN
  Administered 2018-08-20: 300 ug via INTRA_ARTERIAL

## 2018-08-20 MED ORDER — CEFAZOLIN SODIUM-DEXTROSE 2-4 GM/100ML-% IV SOLN
INTRAVENOUS | Status: AC
  Filled 2018-08-20: qty 100

## 2018-08-20 MED ORDER — SODIUM CHLORIDE 0.9 % IV SOLN
INTRAVENOUS | Status: DC
  Administered 2018-08-20: 23:00:00 via INTRAVENOUS

## 2018-08-20 MED ORDER — LIDOCAINE HCL 1 % IJ SOLN
INTRAMUSCULAR | Status: DC | PRN
  Administered 2018-08-20: 1 mL

## 2018-08-20 MED ORDER — PANTOPRAZOLE SODIUM 40 MG IV SOLR
40.0000 mg | Freq: Every day | INTRAVENOUS | Status: DC
  Administered 2018-08-20: 40 mg via INTRAVENOUS
  Filled 2018-08-20: qty 40

## 2018-08-20 MED ORDER — HEPARIN SODIUM (PORCINE) 1000 UNIT/ML IJ SOLN
INTRAMUSCULAR | Status: DC | PRN
  Administered 2018-08-20: 3000 [IU] via INTRAVENOUS
  Administered 2018-08-20: 1000 [IU] via INTRAVENOUS

## 2018-08-20 MED ORDER — VERAPAMIL HCL 2.5 MG/ML IV SOLN
INTRAVENOUS | Status: AC
  Filled 2018-08-20: qty 2

## 2018-08-20 MED ORDER — DEXAMETHASONE SODIUM PHOSPHATE 10 MG/ML IJ SOLN
INTRAMUSCULAR | Status: DC | PRN
  Administered 2018-08-20: 10 mg via INTRAVENOUS

## 2018-08-20 NOTE — Anesthesia Procedure Notes (Signed)
Procedure Name: Intubation Date/Time: 08/20/2018 12:41 PM Performed by: Julieta Bellini, CRNA Pre-anesthesia Checklist: Patient identified, Emergency Drugs available, Suction available and Patient being monitored Patient Re-evaluated:Patient Re-evaluated prior to induction Oxygen Delivery Method: Circle system utilized Preoxygenation: Pre-oxygenation with 100% oxygen Induction Type: IV induction Ventilation: Oral airway inserted - appropriate to patient size, Mask ventilation with difficulty and Two handed mask ventilation required Laryngoscope Size: Mac and 4 Grade View: Grade II Tube type: Oral Tube size: 7.5 mm Number of attempts: 1 Airway Equipment and Method: Stylet Placement Confirmation: ETT inserted through vocal cords under direct vision,  positive ETCO2 and breath sounds checked- equal and bilateral Secured at: 23 cm Tube secured with: Tape Dental Injury: Teeth and Oropharynx as per pre-operative assessment

## 2018-08-20 NOTE — Progress Notes (Signed)
Nigel MormonKim Councilman, RN came to PACU and instructed RN on TR Band care and FlowSheet documentation.

## 2018-08-20 NOTE — Anesthesia Procedure Notes (Signed)
Arterial Line Insertion Start/End12/13/2019 12:20 PM Performed by: Burt Ekurner, Samantha Ashley, CRNA, CRNA  Preanesthetic checklist: patient identified, IV checked, site marked, risks and benefits discussed, surgical consent, monitors and equipment checked, pre-op evaluation, timeout performed and anesthesia consent Lidocaine 1% used for infiltration Left, radial was placed Catheter size: 20 G  Attempts: 1 Procedure performed without using ultrasound guided technique. Following insertion, Biopatch and dressing applied. Post procedure assessment: normal  Patient tolerated the procedure well with no immediate complications.

## 2018-08-20 NOTE — Anesthesia Preprocedure Evaluation (Addendum)
Anesthesia Evaluation  Patient identified by MRN, date of birth, ID band Patient awake    Reviewed: Allergy & Precautions, NPO status , Patient's Chart, lab work & pertinent test results  Airway Mallampati: II  TM Distance: >3 FB Neck ROM: Full    Dental no notable dental hx.    Pulmonary neg pulmonary ROS,    Pulmonary exam normal breath sounds clear to auscultation       Cardiovascular negative cardio ROS Normal cardiovascular exam Rhythm:Regular Rate:Normal     Neuro/Psych  Headaches, Double vision negative psych ROS   GI/Hepatic negative GI ROS, Neg liver ROS,   Endo/Other  Morbid obesity  Renal/GU negative Renal ROS     Musculoskeletal negative musculoskeletal ROS (+)   Abdominal (+) + obese,   Peds  Hematology HLD   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   Reproductive/Obstetrics                            Anesthesia Physical Anesthesia Plan  ASA: III  Anesthesia Plan: General   Post-op Pain Management:    Induction: Intravenous  PONV Risk Score and Plan: 2 and Ondansetron, Propofol infusion and Treatment may vary due to age or medical condition  Airway Management Planned: Oral ETT  Additional Equipment: Arterial line  Intra-op Plan:   Post-operative Plan: Extubation in OR  Informed Consent: I have reviewed the patients History and Physical, chart, labs and discussed the procedure including the risks, benefits and alternatives for the proposed anesthesia with the patient or authorized representative who has indicated his/her understanding and acceptance.   Dental advisory given  Plan Discussed with: CRNA  Anesthesia Plan Comments:         Anesthesia Quick Evaluation

## 2018-08-20 NOTE — Transfer of Care (Signed)
Immediate Anesthesia Transfer of Care Note  Patient: Scott Solis  Procedure(s) Performed: Transvenous embolization of left carotid cavernous fistula (Left )  Patient Location: PACU  Anesthesia Type:General  Level of Consciousness: awake, alert  and oriented  Airway & Oxygen Therapy: Patient Spontanous Breathing and Patient connected to face mask oxygen  Post-op Assessment: Report given to RN and Post -op Vital signs reviewed and stable  Post vital signs: Reviewed and stable  Last Vitals:  Vitals Value Taken Time  BP 137/66 08/20/2018  3:41 PM  Temp    Pulse 105 08/20/2018  3:45 PM  Resp 16 08/20/2018  3:45 PM  SpO2 91 % 08/20/2018  3:45 PM  Vitals shown include unvalidated device data.  Last Pain:  Vitals:   08/20/18 1001  TempSrc:   PainSc: 0-No pain         Complications: No apparent anesthesia complications

## 2018-08-20 NOTE — Brief Op Note (Signed)
  NEUROSURGERY BRIEF OPERATIVE NOTE   PREOP DX: Indirect left carotid-cavernous fistula  POSTOP DX: Same  PROCEDURE: Embolization of left CCF  SURGEON: Katlyn Muldrew  ANESTHESIA: GETA  EBL: Minimal  SPECIMENS: None  DRAINS: None  COMPLICATIONS: None immediate  CONDITION: Hemodynamically stable to PACU  FINDINGS ([facility] 1. Indirect left CCF supplied by bilateral ICA/ECA completely occluded by coil embolization of left cavernous sinus.

## 2018-08-20 NOTE — Sedation Documentation (Signed)
Foley 16 Fr attempted by Darden RestaurantsSSpencer RN, unable to place Condom cath placed.

## 2018-08-20 NOTE — Sedation Documentation (Signed)
Bedside report given to PACU RN.

## 2018-08-20 NOTE — Progress Notes (Signed)
Amy from [facility] came to PACU and instructed RN on assessing and caring for TR Band.  RN verbalized understanding of removing air with syringe incrementally, assessing for hematoma and hardness around the site, and keeping arm very still and immobile.

## 2018-08-21 MED ORDER — HYDROCODONE-ACETAMINOPHEN 5-325 MG PO TABS: 1.0000 | ORAL_TABLET | ORAL | 0 refills | Status: DC | PRN

## 2018-08-21 NOTE — Plan of Care (Signed)
Pt education completed

## 2018-08-21 NOTE — Progress Notes (Signed)
Neurosurgery Service Progress Note  Subjective: No acute events overnight, no new complaints   Objective: Vitals:   08/21/18 0800 08/21/18 0900 08/21/18 1000 08/21/18 1100  BP: 118/66 119/65 122/68 122/84  Pulse: 86 89 82 84  Resp: 19 20 (!) 21 19  Temp: 98 F (36.7 C)     TempSrc: Axillary     SpO2: 96% 97% 96% 99%  Weight:      Height:       Temp (24hrs), Avg:97.8 F (36.6 C), Min:97.5 F (36.4 C), Max:98 F (36.7 C)  CBC Latest Ref Rng & Units 08/18/2018 07/27/2018  WBC 4.0 - 10.5 K/uL 5.9 5.1  Hemoglobin 13.0 - 17.0 g/dL 13.015.5 86.515.7  Hematocrit 78.439.0 - 52.0 % 48.5 50.5  Platelets 150 - 400 K/uL 161 163   BMP Latest Ref Rng & Units 08/18/2018 07/27/2018  Glucose 70 - 99 mg/dL 696(E100(H) 98  BUN 6 - 20 mg/dL 12 12  Creatinine 9.520.61 - 1.24 mg/dL 8.410.84 3.240.94  Sodium 401135 - 145 mmol/L 141 140  Potassium 3.5 - 5.1 mmol/L 4.2 3.9  Chloride 98 - 111 mmol/L 104 105  CO2 22 - 32 mmol/L 27 26  Calcium 8.9 - 10.3 mg/dL 9.1 9.0    Intake/Output Summary (Last 24 hours) at 08/21/2018 1226 Last data filed at 08/21/2018 0800 Gross per 24 hour  Intake 1683.49 ml  Output 3250 ml  Net -1566.51 ml    Current Facility-Administered Medications:  .  0.9 %  sodium chloride infusion, , Intravenous, Continuous, Lisbeth RenshawNundkumar, Neelesh, MD, Last Rate: 75 mL/hr at 08/21/18 0600 .  dexamethasone (DECADRON) tablet 6 mg, 6 mg, Oral, Q6H, 6 mg at 08/21/18 0623 **FOLLOWED BY** [START ON 08/22/2018] dexamethasone (DECADRON) tablet 4 mg, 4 mg, Oral, Q6H **FOLLOWED BY** [START ON 08/23/2018] dexamethasone (DECADRON) tablet 4 mg, 4 mg, Oral, Q8H, Nundkumar, Neelesh, MD .  HYDROcodone-acetaminophen (NORCO/VICODIN) 5-325 MG per tablet 1 tablet, 1 tablet, Oral, Q4H PRN, Lisbeth RenshawNundkumar, Neelesh, MD .  labetalol (NORMODYNE,TRANDATE) injection 10-40 mg, 10-40 mg, Intravenous, Q10 min PRN, Lisbeth RenshawNundkumar, Neelesh, MD, 10 mg at 08/20/18 2047 .  morphine 2 MG/ML injection 1-2 mg, 1-2 mg, Intravenous, Q2H PRN, Lisbeth RenshawNundkumar, Neelesh,  MD .  multivitamin with minerals tablet 1 tablet, 1 tablet, Oral, Daily, Lisbeth RenshawNundkumar, Neelesh, MD .  ondansetron (ZOFRAN) tablet 4 mg, 4 mg, Oral, Q4H PRN **OR** ondansetron (ZOFRAN) injection 4 mg, 4 mg, Intravenous, Q4H PRN, Lisbeth RenshawNundkumar, Neelesh, MD .  pantoprazole (PROTONIX) injection 40 mg, 40 mg, Intravenous, QHS, Lisbeth RenshawNundkumar, Neelesh, MD, 40 mg at 08/20/18 2323   Physical Exam: AOx3, PERRL, EOMI except for L 6th CN palsy, FS, Strength 5/5 x4, SILTx4  Assessment & Plan: 57 y.o. man s/p coil embolization of CC fistula, recovering well, at neurologic baseline. -discharge home this morning  Scott Solis  08/21/18 12:26 PM

## 2018-08-21 NOTE — Progress Notes (Signed)
Pt ambulated around the unit twice (approx. 500 ft). Steady gait, no issues with ambulation. VSS. No changes in assessment. Discharge instructions reviewed with pt and pt's wife present. All questions answered. Rx for pain medication handed to pt. All PIVs removed. Pt belongings returned to pt. Pt waiting for family member to DC home via car.

## 2018-08-21 NOTE — Discharge Instructions (Signed)
Discharge Instructions  No restriction in activities, slowly increase your activity back to normal.   Okay to shower on the day of discharge. Use regular soap and water and try to be gentle when cleaning your incision.   Follow up with Dr. Nundkumar in 2 weeks after discharge. If you do not already have a discharge appointment, please call his office at [phone] to schedule a follow up appointment. If you have any concerns or questions, please call the office and let us know.  

## 2018-08-21 NOTE — Discharge Summary (Signed)
Discharge Summary  Date of Admission: 08/20/2018  Date of Discharge: 08/21/18  Attending Physician: Lisbeth RenshawNeelesh Nundkumar, MD  Hospital Course: Patient was admitted following an uncomplicated coil embolization of a carotid-cavernous fistula. He was recovered in PACU and transferred to 4N. His hospital course was uncomplicated and the patient was discharged home on 08/21/18. He will follow up in clinic with Dr. Conchita ParisNundkumar in 2 weeks.  Neurologic exam at discharge:  AOx3, PERRL, +L 6th CN palsy, FS, TM Strength 5/5 x4, SILTx4, at neurologic baseline Groin site c/d/i, no e/o hematoma, distal pulses intact  Jadene Pierinihomas A Kimberli Winne, MD 08/21/18 12:28 PM

## 2018-08-21 NOTE — Anesthesia Postprocedure Evaluation (Signed)
Anesthesia Post Note  Patient: Altha HarmJeffery Bingman  Procedure(s) Performed: Transvenous embolization of left carotid cavernous fistula (Left )     Patient location during evaluation: PACU Anesthesia Type: General Level of consciousness: awake and alert Pain management: pain level controlled Vital Signs Assessment: post-procedure vital signs reviewed and stable Respiratory status: spontaneous breathing, nonlabored ventilation, respiratory function stable and patient connected to nasal cannula oxygen Cardiovascular status: blood pressure returned to baseline and stable Postop Assessment: no apparent nausea or vomiting Anesthetic complications: no    Last Vitals:  Vitals:   08/21/18 0500 08/21/18 0600  BP: 112/69 108/65  Pulse: 88 68  Resp: 17 17  Temp:    SpO2: 91% 90%    Last Pain:  Vitals:   08/21/18 0400  TempSrc: Oral  PainSc: 0-No pain                 Ann-Marie Kluge P Kristain Hu

## 2018-08-23 ENCOUNTER — Other Ambulatory Visit (HOSPITAL_COMMUNITY): Payer: Self-pay | Admitting: Neurosurgery

## 2018-08-23 ENCOUNTER — Encounter (HOSPITAL_COMMUNITY): Payer: Self-pay | Admitting: Radiology

## 2018-08-23 DIAGNOSIS — I671 Cerebral aneurysm, nonruptured: Secondary | ICD-10-CM

## 2019-01-04 ENCOUNTER — Encounter (HOSPITAL_COMMUNITY): Payer: Self-pay

## 2019-01-04 NOTE — Addendum Note (Signed)
Encounter addended by: Herbie Baltimore on: 01/04/2019 7:48 AM  Actions taken: Imaging Exam ended, Clinical Note Signed

## 2019-01-04 NOTE — Addendum Note (Signed)
Encounter addended by: Herbie Baltimore on: 01/04/2019 7:45 AM  Actions taken: Imaging Exam ended

## 2019-01-26 ENCOUNTER — Other Ambulatory Visit: Payer: Self-pay | Admitting: Neurosurgery

## 2019-01-26 DIAGNOSIS — I671 Cerebral aneurysm, nonruptured: Secondary | ICD-10-CM

## 2019-02-22 ENCOUNTER — Other Ambulatory Visit: Payer: Self-pay | Admitting: Neurosurgery

## 2019-03-10 NOTE — H&P (Signed)
Chief Complaint   Carotid cavernous fistula  HPI   HPI: Scott Solis is a 58 y.o. 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  Symptoms gradually improved after treatment.  He presents today for routine diagnostic cerebral angiogram for monitoring.  He is without any concerns.  Patient Active Problem List   Diagnosis Date Noted  . 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 Diplopia 06/29/2018    PMH: Past Medical History:  Diagnosis Date  . Diplopia   . Dyslipidemia   . Headache   . Medical history non-contributory   . Obesity   . Ptosis    left    PSH: Past Surgical History:  Procedure Laterality Date  . IR ANGIO EXTERNAL CAROTID SEL EXT CAROTID BILAT MOD SED  07/27/2018  . IR ANGIO EXTERNAL CAROTID SEL EXT CAROTID UNI L MOD SED  08/20/2018  . IR ANGIO INTRA EXTRACRAN SEL COM CAROTID INNOMINATE BILAT MOD SED  08/20/2018  . IR ANGIO INTRA EXTRACRAN SEL INTERNAL CAROTID BILAT MOD SED  07/27/2018  . IR ANGIO VERTEBRAL SEL VERTEBRAL UNI R MOD SED  07/27/2018  . IR ANGIOGRAM FOLLOW UP STUDY  08/20/2018  . IR TRANSCATH/EMBOLIZ  08/20/2018  . IR US GUIDE VASC ACCESS RIGHT  08/20/2018  . IR US GUIDE VASC ACCESS RIGHT  08/20/2018  . IR VENO SAGITTAL SINUS  08/20/2018  . KNEE SURGERY Right   . RADIOLOGY WITH ANESTHESIA Left 08/20/2018   Procedure: Transvenous embolization of left carotid cavernous fistula;  Surgeon: Lisbeth RenshawNundkumar, Neelesh, MD;  [facility];  Service: Radiology;  Laterality: Left;    (Not in a hospital admission)   SH: Social History   Tobacco Use  . Smoking status: Never Smoker  . Smokeless tobacco: Never Used  Substance Use Topics  . Alcohol use: Yes    Comment: rare  . Drug use: Never    MEDS: Prior to Admission medications   Medication Sig Start Date End Date Taking? Authorizing  Provider  CALCIUM-MAGNESIUM PO Take 1 tablet by mouth daily.    [provider]  HYDROcodone-acetaminophen (NORCO/VICODIN) 5-325 MG tablet Take 1 tablet by mouth every 4 (four) hours as needed for moderate pain. 08/21/18   Jadene Pierinistergard, Thomas A, MD  Multiple Vitamin (MULTIVITAMIN) capsule Take 1 capsule by mouth daily.     [provider]  pyridostigmine (MESTINON) 60 MG tablet Take 1 tablet (60 mg total) by mouth 3 (three) times daily. Patient not taking: Reported on 07/21/2018 06/29/18   Levert FeinsteinYan, Yijun, MD  SUMAtriptan (IMITREX) 50 MG tablet Take 1 tablet (50 mg total) by mouth every 2 (two) hours as needed for migraine. May repeat in 2 hours if headache persists or recurs. 06/29/18   Levert FeinsteinYan, Yijun, MD    ALLERGY: No Known Allergies  Social History   Tobacco Use  . Smoking status: Never Smoker  . Smokeless tobacco: Never Used  Substance Use Topics  . Alcohol use: Yes    Comment: rare     Family History  Problem Relation Age of Onset  . Healthy Mother   . CAD Father   . Healthy Brother      ROS   ROS  Exam   There were no vitals filed for this visit. General appearance: WDWN, NAD Eyes: No scleral injection Cardiovascular: Regular rate and rhythm without murmurs, rubs, gallops. No edema or  variciosities. Distal pulses normal. Pulmonary: Effort normal, non-labored breathing Musculoskeletal:     Muscle tone upper extremities: Normal    Muscle tone lower extremities: Normal    Motor exam: Upper Extremities Deltoid Bicep Tricep Grip  Right 5/5 5/5 5/5 5/5  Left 5/5 5/5 5/5 5/5   Lower Extremity IP Quad PF DF EHL  Right 5/5 5/5 5/5 5/5 5/5  Left 5/5 5/5 5/5 5/5 5/5   Neurological Mental Status:    - Patient is awake, alert, oriented to person, place, month, year, and situation    - Patient is able to give a clear and coherent history.    - No signs of aphasia or neglect Cranial Nerves: normal Sensory: Sensation grossly intact to LT  Results -  Imaging/Labs   No results found for this or any previous visit (from the past 48 hour(s)).  No results found.  Impression/Plan   58 y.o. male  Approximately 6 months status post coil embolization of left carotid-cavernous fistula.  We will proceed with routine diagnostic cerebral angiogram for monitoring.    While in the office risks, benefits and alternatives were discussed.  Patient stated understanding and wished to proceed.  Ferne Reus, PA-C [facility] [employer]

## 2019-03-14 ENCOUNTER — Other Ambulatory Visit (HOSPITAL_COMMUNITY)
Admission: RE | Admit: 2019-03-14 | Discharge: 2019-03-14 | Disposition: A | Discharge status: Home / Self Care | Payer: Commercial Managed Care - PPO | Source: Ambulatory Visit | Attending: Neurosurgery | Admitting: Neurosurgery

## 2019-03-14 DIAGNOSIS — Z1159 Encounter for screening for other viral diseases: Secondary | ICD-10-CM | POA: Insufficient documentation

## 2019-03-14 DIAGNOSIS — Z01812 Encounter for preprocedural laboratory examination: Secondary | ICD-10-CM | POA: Insufficient documentation

## 2019-03-15 ENCOUNTER — Ambulatory Visit (HOSPITAL_COMMUNITY)
Admission: RE | Admit: 2019-03-15 | Discharge: 2019-03-15 | Disposition: A | Discharge status: Home / Self Care | Payer: Commercial Managed Care - PPO | Source: Ambulatory Visit | Attending: Neurosurgery | Admitting: Neurosurgery

## 2019-03-15 ENCOUNTER — Other Ambulatory Visit: Payer: Self-pay | Admitting: Neurosurgery

## 2019-03-15 ENCOUNTER — Other Ambulatory Visit: Payer: Self-pay

## 2019-03-15 ENCOUNTER — Encounter (HOSPITAL_COMMUNITY): Payer: Self-pay | Admitting: Neurosurgery

## 2019-03-15 DIAGNOSIS — E785 Hyperlipidemia, unspecified: Secondary | ICD-10-CM | POA: Diagnosis not present

## 2019-03-15 DIAGNOSIS — H02402 Unspecified ptosis of left eyelid: Secondary | ICD-10-CM | POA: Insufficient documentation

## 2019-03-15 DIAGNOSIS — I671 Cerebral aneurysm, nonruptured: Secondary | ICD-10-CM | POA: Diagnosis present

## 2019-03-15 DIAGNOSIS — E669 Obesity, unspecified: Secondary | ICD-10-CM | POA: Insufficient documentation

## 2019-03-15 DIAGNOSIS — Z6836 Body mass index (BMI) 36.0-36.9, adult: Secondary | ICD-10-CM | POA: Insufficient documentation

## 2019-03-15 HISTORY — PX: IR US GUIDE VASC ACCESS RIGHT: IMG2390

## 2019-03-15 HISTORY — PX: IR ANGIO INTRA EXTRACRAN SEL COM CAROTID INNOMINATE BILAT MOD SED: IMG5360

## 2019-03-15 LAB — CBC WITH DIFFERENTIAL/PLATELET
Abs Immature Granulocytes: 0.03 10*3/uL (ref 0.00–0.07)
Basophils Absolute: 0 10*3/uL (ref 0.0–0.1)
Basophils Relative: 0 %
Eosinophils Absolute: 0.1 10*3/uL (ref 0.0–0.5)
Eosinophils Relative: 2 %
HCT: 49.3 % (ref 39.0–52.0)
Hemoglobin: 16.1 g/dL (ref 13.0–17.0)
Immature Granulocytes: 1 %
Lymphocytes Relative: 21 %
Lymphs Abs: 1.1 10*3/uL (ref 0.7–4.0)
MCH: 29.7 pg (ref 26.0–34.0)
MCHC: 32.7 g/dL (ref 30.0–36.0)
MCV: 91 fL (ref 80.0–100.0)
Monocytes Absolute: 0.5 10*3/uL (ref 0.1–1.0)
Monocytes Relative: 9 %
Neutro Abs: 3.3 10*3/uL (ref 1.7–7.7)
Neutrophils Relative %: 67 %
Platelets: 169 10*3/uL (ref 150–400)
RBC: 5.42 MIL/uL (ref 4.22–5.81)
RDW: 13 % (ref 11.5–15.5)
WBC: 5 10*3/uL (ref 4.0–10.5)
nRBC: 0 % (ref 0.0–0.2)

## 2019-03-15 LAB — BASIC METABOLIC PANEL
Anion gap: 9 (ref 5–15)
BUN: 11 mg/dL (ref 6–20)
CO2: 27 mmol/L (ref 22–32)
Calcium: 9.1 mg/dL (ref 8.9–10.3)
Chloride: 105 mmol/L (ref 98–111)
Creatinine, Ser: 0.96 mg/dL (ref 0.61–1.24)
GFR calc Af Amer: >60 mL/min (ref >60)
GFR calc non Af Amer: >60 mL/min (ref >60)
Glucose, Bld: 97 mg/dL (ref 70–99)
Potassium: 3.9 mmol/L (ref 3.5–5.1)
Sodium: 141 mmol/L (ref 135–145)

## 2019-03-15 LAB — APTT: aPTT: 28 seconds (ref 24–36)

## 2019-03-15 LAB — SARS CORONAVIRUS 2 (TAT 6-24 HRS): SARS Coronavirus 2: NEGATIVE

## 2019-03-15 LAB — PROTIME-INR
INR: 1.1 (ref 0.8–1.2)
Prothrombin Time: 13.8 seconds (ref 11.4–15.2)

## 2019-03-15 MED ORDER — IOHEXOL 300 MG/ML  SOLN: 25.0000 mL | Freq: Once | INTRAMUSCULAR | Status: DC | PRN

## 2019-03-15 MED ORDER — MIDAZOLAM HCL 2 MG/2ML IJ SOLN
INTRAMUSCULAR | Status: AC
  Filled 2019-03-15: qty 2

## 2019-03-15 MED ORDER — MIDAZOLAM HCL 2 MG/2ML IJ SOLN
INTRAMUSCULAR | Status: AC | PRN
  Administered 2019-03-15: 1 mg via INTRAVENOUS

## 2019-03-15 MED ORDER — VERAPAMIL HCL 2.5 MG/ML IV SOLN
INTRAVENOUS | Status: AC
  Administered 2019-03-15: 10:00:00 2.5 mg
  Filled 2019-03-15: qty 2

## 2019-03-15 MED ORDER — NITROGLYCERIN 1 MG/10 ML FOR IR/CATH LAB
INTRA_ARTERIAL | Status: AC
  Administered 2019-03-15: 10:00:00 500 ug
  Filled 2019-03-15: qty 10

## 2019-03-15 MED ORDER — CHLORHEXIDINE GLUCONATE CLOTH 2 % EX PADS: 6.0000 | MEDICATED_PAD | Freq: Once | CUTANEOUS | Status: DC

## 2019-03-15 MED ORDER — HEPARIN SODIUM (PORCINE) 1000 UNIT/ML IJ SOLN
INTRAMUSCULAR | Status: AC
  Filled 2019-03-15: qty 1

## 2019-03-15 MED ORDER — SODIUM CHLORIDE 0.9 % IV SOLN
INTRAVENOUS | Status: DC
  Administered 2019-03-15: 11:00:00 via INTRAVENOUS

## 2019-03-15 MED ORDER — FENTANYL CITRATE (PF) 100 MCG/2ML IJ SOLN
INTRAMUSCULAR | Status: AC
  Filled 2019-03-15: qty 2

## 2019-03-15 MED ORDER — HYDROCODONE-ACETAMINOPHEN 5-325 MG PO TABS: 1.0000 | ORAL_TABLET | ORAL | Status: DC | PRN

## 2019-03-15 MED ORDER — CEFAZOLIN SODIUM-DEXTROSE 2-4 GM/100ML-% IV SOLN: 2.0000 g | INTRAVENOUS | Status: DC

## 2019-03-15 MED ORDER — HEPARIN SODIUM (PORCINE) 1000 UNIT/ML IJ SOLN
INTRAMUSCULAR | Status: AC | PRN
  Administered 2019-03-15: 3000 [IU] via INTRAVENOUS

## 2019-03-15 MED ORDER — FENTANYL CITRATE (PF) 100 MCG/2ML IJ SOLN
INTRAMUSCULAR | Status: AC | PRN
  Administered 2019-03-15: 25 ug via INTRAVENOUS

## 2019-03-15 MED ORDER — LIDOCAINE HCL 1 % IJ SOLN
INTRAMUSCULAR | Status: AC
  Administered 2019-03-15: 10:00:00 1 mL
  Filled 2019-03-15: qty 20

## 2019-03-15 NOTE — Brief Op Note (Signed)
  NEUROSURGERY BRIEF OPERATIVE  NOTE   PREOP DX: Left [facility]  POSTOP DX: Same  PROCEDURE: Diagnostic cerebral angiogram  SURGEON: Dr. Consuella Lose, MD  ANESTHESIA: IV Sedation with Local  EBL: Minimal  SPECIMENS: None  COMPLICATIONS: None  CONDITION: Stable to recovery  FINDINGS (Full report in [facility]): 1. No further filling of previously seen left [facility] now 58months after embolization. '

## 2019-03-15 NOTE — Discharge Instructions (Signed)
Radial Site Care ° °This sheet gives you information about how to care for yourself after your procedure. Your health care provider may also give you more specific instructions. If you have problems or questions, contact your health care provider. °What can I expect after the procedure? °After the procedure, it is common to have: °· Bruising and tenderness at the catheter insertion area. °Follow these instructions at home: °Medicines °· Take over-the-counter and prescription medicines only as told by your health care provider. °Insertion site care °· Follow instructions from your health care provider about how to take care of your insertion site. Make sure you: °? Wash your hands with soap and water before you change your bandage (dressing). If soap and water are not available, use hand sanitizer. °? Change your dressing as told by your health care provider. °? Leave stitches (sutures), skin glue, or adhesive strips in place. These skin closures may need to stay in place for 2 weeks or longer. If adhesive strip edges start to loosen and curl up, you may trim the loose edges. Do not remove adhesive strips completely unless your health care provider tells you to do that. °· Check your insertion site every day for signs of infection. Check for: °? Redness, swelling, or pain. °? Fluid or blood. °? Pus or a bad smell. °? Warmth. °· Do not take baths, swim, or use a hot tub until your health care provider approves. °· You may shower 24-48 hours after the procedure, or as directed by your health care provider. °? Remove the dressing and gently wash the site with plain soap and water. °? Pat the area dry with a clean towel. °? Do not rub the site. That could cause bleeding. °· Do not apply powder or lotion to the site. °Activity ° °· For 24 hours after the procedure, or as directed by your health care provider: °? Do not flex or bend the affected arm. °? Do not push or pull heavy objects with the affected arm. °? Do not  drive yourself home from the hospital or clinic. You may drive 24 hours after the procedure unless your health care provider tells you not to. °? Do not operate machinery or power tools. °· Do not lift anything that is heavier than 10 lb (4.5 kg), or the limit that you are told, until your health care provider says that it is safe. °· Ask your health care provider when it is okay to: °? Return to work or school. °? Resume usual physical activities or sports. °? Resume sexual activity. °General instructions °· If the catheter site starts to bleed, raise your arm and put firm pressure on the site. If the bleeding does not stop, get help right away. This is a medical emergency. °· If you went home on the same day as your procedure, a responsible adult should be with you for the first 24 hours after you arrive home. °· Keep all follow-up visits as told by your health care provider. This is important. °Contact a health care provider if: °· You have a fever. °· You have redness, swelling, or yellow drainage around your insertion site. °Get help right away if: °· You have unusual pain at the radial site. °· The catheter insertion area swells very fast. °· The insertion area is bleeding, and the bleeding does not stop when you hold steady pressure on the area. °· Your arm or hand becomes pale, cool, tingly, or numb. °These symptoms may represent a serious problem   that is an emergency. Do not wait to see if the symptoms will go away. Get medical help right away. Call your local emergency services (911 in the [place] Do not drive yourself to the hospital. °Summary °· After the procedure, it is common to have bruising and tenderness at the site. °· Follow instructions from your health care provider about how to take care of your radial site wound. Check the wound every day for signs of infection. °· Do not lift anything that is heavier than 10 lb (4.5 kg), or the limit that you are told, until your health care provider says  that it is safe. °This information is not intended to replace advice given to you by your health care provider. Make sure you discuss any questions you have with your health care provider. °Document Released: 09/27/2010 Document Revised: 09/30/2017 Document Reviewed: 09/30/2017 °Elsevier Patient Education © 2020 [employer] ° °

## 2019-03-16 ENCOUNTER — Encounter (HOSPITAL_COMMUNITY): Payer: Self-pay

## 2019-03-16 ENCOUNTER — Other Ambulatory Visit: Payer: Self-pay | Admitting: Neurosurgery

## 2019-03-16 DIAGNOSIS — I671 Cerebral aneurysm, nonruptured: Secondary | ICD-10-CM

## 2019-05-26 ENCOUNTER — Other Ambulatory Visit: Payer: Self-pay

## 2019-05-26 ENCOUNTER — Encounter (HOSPITAL_COMMUNITY): Payer: Self-pay

## 2019-05-26 ENCOUNTER — Other Ambulatory Visit (HOSPITAL_COMMUNITY)
Admission: RE | Admit: 2019-05-26 | Discharge: 2019-05-26 | Disposition: A | Discharge status: Home / Self Care | Payer: Commercial Managed Care - PPO | Source: Ambulatory Visit | Attending: Orthopedic Surgery | Admitting: Orthopedic Surgery

## 2019-05-26 DIAGNOSIS — Z20828 Contact with and (suspected) exposure to other viral communicable diseases: Secondary | ICD-10-CM | POA: Diagnosis not present

## 2019-05-26 DIAGNOSIS — Z01812 Encounter for preprocedural laboratory examination: Secondary | ICD-10-CM | POA: Insufficient documentation

## 2019-05-26 LAB — SARS CORONAVIRUS 2 (TAT 6-24 HRS): SARS Coronavirus 2: NEGATIVE

## 2019-05-26 NOTE — Progress Notes (Signed)
PCP - Dr. Lovette Cliche Chest x-ray - patient denies EKG - patient denies Stress Test - patient denies ECHO - patient denies Cardiac Cath - patient denies  Sleep Study - patient denies   Patient denies shortness of breath, fever, cough and chest pain, cold, flu, or COVID symptoms.  Patient went for his COVID test today.   Patient and patient's wife verbalized understanding of instructions that were given to them and will call with any questions or concerns.  Patient to arrive at Entrance A at 1415 and get checked in at admitting.  Per Dr. Gifford Shave, patient can eat and drink up until 0715am the morning of surgery.

## 2019-05-26 NOTE — H&P (Signed)
Scott Solis is an 58 y.o. male.   Chief Complaint: Patient presents with severe lacerations to the middle finger ring finger and small finger right hand.  He has loss of sensation about the radial digital nerve of the ring finger and loss of flexion about the middle and ring.  We will plan for exploration and repair reconstruction is necessary.  This was a blender injury days ago.  He has been on Keflex. HPI: Patient presents for evaluation and treatment of the of their upper extremity predicament. The patient denies neck, back, chest or  abdominal pain. The patient notes that they have no lower extremity problems. The patients primary complaint is noted. We are planning surgical care pathway for the upper extremity.  Past Medical History:  Diagnosis Date  . Diplopia   . Dyslipidemia   . Headache   . Medical history non-contributory   . Obesity   . Ptosis    left    Past Surgical History:  Procedure Laterality Date  . IR ANGIO EXTERNAL CAROTID SEL EXT CAROTID BILAT MOD SED  07/27/2018  . IR ANGIO EXTERNAL CAROTID SEL EXT CAROTID UNI L MOD SED  08/20/2018  . IR ANGIO INTRA EXTRACRAN SEL COM CAROTID INNOMINATE BILAT MOD SED  08/20/2018  . IR ANGIO INTRA EXTRACRAN SEL COM CAROTID INNOMINATE BILAT MOD SED  03/15/2019  . IR ANGIO INTRA EXTRACRAN SEL INTERNAL CAROTID BILAT MOD SED  07/27/2018  . IR ANGIO VERTEBRAL SEL VERTEBRAL UNI R MOD SED  07/27/2018  . IR ANGIOGRAM FOLLOW UP STUDY  08/20/2018  . IR TRANSCATH/EMBOLIZ  08/20/2018  . IR US GUIDE VASC ACCESS RIGHT  08/20/2018  . IR US GUIDE VASC ACCESS RIGHT  08/20/2018  . IR US GUIDE VASC ACCESS RIGHT  03/15/2019  . IR VENO SAGITTAL SINUS  08/20/2018  . KNEE SURGERY Right   . RADIOLOGY WITH ANESTHESIA Left 08/20/2018   Procedure: Transvenous embolization of left carotid cavernous fistula;  Surgeon: Lisbeth RenshawNundkumar, Neelesh, MD;  [facility];  Service: Radiology;  Laterality: Left;    Family History  Problem Relation Age of Onset  .  Healthy Mother   . CAD Father   . Healthy Brother    Social History:  reports that he has never smoked. He has never used smokeless tobacco. He reports current alcohol use. He reports that he does not use drugs.  Allergies: No Known Allergies  No medications prior to admission.    No results found for this or any previous visit (from the past 48 hour(s)). No results found.  Review of Systems  Respiratory: Negative.   Cardiovascular: Negative.   Gastrointestinal: Negative.   Genitourinary: Negative.     There were no vitals taken for this visit. Physical Exam  Lacerations to the middle ring and small finger with loss of function including tendon excursion about the ring and middle finger as well as multiple jagged lacerations with skin integrity questionable.  He has complete loss of the radial digital nerve to the ring finger.  The patient is alert and oriented in no acute distress. The patient complains of pain in the affected upper extremity.  The patient is noted to have a normal HEENT exam. Lung fields show equal chest expansion and no shortness of breath. Abdomen exam is nontender without distention. Lower extremity examination does not show any fracture dislocation or blood clot symptoms. Pelvis is stable and the neck and back are stable and nontender. Assessment/Plan We will plan for irrigation debridement exploration and repair  of tendon nerve and artery as necessary right middle, ring, and small finger  We are planning surgery for your upper extremity. The risk and benefits of surgery to include risk of bleeding, infection, anesthesia,  damage to normal structures and failure of the surgery to accomplish its intended goals of relieving symptoms and restoring function have been discussed in detail. With this in mind we plan to proceed. I have specifically discussed with the patient the pre-and postoperative regime and the dos and don'ts and risk and benefits in great  detail. Risk and benefits of surgery also include risk of dystrophy(CRPS), chronic nerve pain, failure of the healing process to go onto completion and other inherent risks of surgery The relavent the pathophysiology of the disease/injury process, as well as the alternatives for treatment and postoperative course of action has been discussed in great detail with the patient who desires to proceed.  We will do everything in our power to help you (the patient) restore function to the upper extremity. It is a pleasure to see this patient today.    Willa Frater III, MD 05/26/2019, 6:21 PM

## 2019-05-27 ENCOUNTER — Encounter (HOSPITAL_COMMUNITY): Payer: Self-pay | Admitting: *Deleted

## 2019-05-27 ENCOUNTER — Ambulatory Visit (HOSPITAL_COMMUNITY)
Admission: RE | Admit: 2019-05-27 | Discharge: 2019-05-27 | Disposition: A | Discharge status: Home / Self Care | Payer: Commercial Managed Care - PPO | Attending: Orthopedic Surgery | Admitting: Orthopedic Surgery

## 2019-05-27 ENCOUNTER — Encounter (HOSPITAL_COMMUNITY)
Admission: RE | Disposition: A | Discharge status: Home / Self Care | Payer: Self-pay | Source: Home / Self Care | Attending: Orthopedic Surgery

## 2019-05-27 ENCOUNTER — Ambulatory Visit (HOSPITAL_COMMUNITY): Payer: Commercial Managed Care - PPO | Admitting: Anesthesiology

## 2019-05-27 DIAGNOSIS — S61212A Laceration without foreign body of right middle finger without damage to nail, initial encounter: Secondary | ICD-10-CM | POA: Diagnosis present

## 2019-05-27 DIAGNOSIS — S62612B Displaced fracture of proximal phalanx of right middle finger, initial encounter for open fracture: Secondary | ICD-10-CM | POA: Diagnosis not present

## 2019-05-27 DIAGNOSIS — S64492A Injury of digital nerve of right middle finger, initial encounter: Secondary | ICD-10-CM | POA: Diagnosis not present

## 2019-05-27 DIAGNOSIS — W290XXA Contact with powered kitchen appliance, initial encounter: Secondary | ICD-10-CM | POA: Diagnosis not present

## 2019-05-27 DIAGNOSIS — S61214A Laceration without foreign body of right ring finger without damage to nail, initial encounter: Secondary | ICD-10-CM | POA: Diagnosis not present

## 2019-05-27 DIAGNOSIS — Z6835 Body mass index (BMI) 35.0-35.9, adult: Secondary | ICD-10-CM | POA: Insufficient documentation

## 2019-05-27 DIAGNOSIS — S61216A Laceration without foreign body of right little finger without damage to nail, initial encounter: Secondary | ICD-10-CM | POA: Insufficient documentation

## 2019-05-27 DIAGNOSIS — S64494A Injury of digital nerve of right ring finger, initial encounter: Secondary | ICD-10-CM | POA: Diagnosis not present

## 2019-05-27 HISTORY — PX: NERVE, TENDON AND ARTERY REPAIR: SHX5695

## 2019-05-27 LAB — HEMOGLOBIN: Hemoglobin: 16.5 g/dL (ref 13.0–17.0)

## 2019-05-27 SURGERY — NERVE, TENDON AND ARTERY REPAIR
Anesthesia: Monitor Anesthesia Care | Site: Hand | Laterality: Right

## 2019-05-27 MED ORDER — ETOMIDATE 2 MG/ML IV SOLN
INTRAVENOUS | Status: AC
  Filled 2019-05-27: qty 10

## 2019-05-27 MED ORDER — PROMETHAZINE HCL 25 MG/ML IJ SOLN: 6.2500 mg | INTRAMUSCULAR | Status: DC | PRN

## 2019-05-27 MED ORDER — FENTANYL CITRATE (PF) 100 MCG/2ML IJ SOLN: 25.0000 ug | INTRAMUSCULAR | Status: DC | PRN

## 2019-05-27 MED ORDER — FENTANYL CITRATE (PF) 250 MCG/5ML IJ SOLN
INTRAMUSCULAR | Status: AC
  Filled 2019-05-27: qty 5

## 2019-05-27 MED ORDER — PROPOFOL 10 MG/ML IV BOLUS
INTRAVENOUS | Status: AC
  Filled 2019-05-27: qty 20

## 2019-05-27 MED ORDER — OXYCODONE HCL 5 MG/5ML PO SOLN: 5.0000 mg | Freq: Once | ORAL | Status: DC | PRN

## 2019-05-27 MED ORDER — LACTATED RINGERS IV SOLN
INTRAVENOUS | Status: DC
  Administered 2019-05-27: 15:00:00 via INTRAVENOUS

## 2019-05-27 MED ORDER — CHLORHEXIDINE GLUCONATE 4 % EX LIQD: 60.0000 mL | Freq: Once | CUTANEOUS | Status: DC

## 2019-05-27 MED ORDER — LIDOCAINE HCL (CARDIAC) PF 100 MG/5ML IV SOSY
PREFILLED_SYRINGE | INTRAVENOUS | Status: DC | PRN
  Administered 2019-05-27: 10 mL via INTRAVENOUS

## 2019-05-27 MED ORDER — CEFAZOLIN SODIUM-DEXTROSE 2-4 GM/100ML-% IV SOLN
2.0000 g | INTRAVENOUS | Status: AC
  Administered 2019-05-27: 2 g via INTRAVENOUS

## 2019-05-27 MED ORDER — BUPIVACAINE HCL (PF) 0.5 % IJ SOLN
INTRAMUSCULAR | Status: DC | PRN
  Administered 2019-05-27: 30 mL via PERINEURAL

## 2019-05-27 MED ORDER — 0.9 % SODIUM CHLORIDE (POUR BTL) OPTIME
TOPICAL | Status: DC | PRN
  Administered 2019-05-27: 1000 mL

## 2019-05-27 MED ORDER — SODIUM CHLORIDE 0.9 % IR SOLN
Status: DC | PRN
  Administered 2019-05-27: 3000 mL

## 2019-05-27 MED ORDER — PROPOFOL 500 MG/50ML IV EMUL
INTRAVENOUS | Status: DC | PRN
  Administered 2019-05-27: 100 ug/kg/min via INTRAVENOUS

## 2019-05-27 MED ORDER — FENTANYL CITRATE (PF) 100 MCG/2ML IJ SOLN
INTRAMUSCULAR | Status: DC | PRN
  Administered 2019-05-27 (×3): 50 ug via INTRAVENOUS

## 2019-05-27 MED ORDER — FENTANYL CITRATE (PF) 100 MCG/2ML IJ SOLN
100.0000 ug | Freq: Once | INTRAMUSCULAR | Status: AC
  Administered 2019-05-27: 17:00:00 100 ug via INTRAVENOUS

## 2019-05-27 MED ORDER — MIDAZOLAM HCL 2 MG/2ML IJ SOLN
INTRAMUSCULAR | Status: AC
  Administered 2019-05-27: 2 mg via INTRAVENOUS
  Filled 2019-05-27: qty 2

## 2019-05-27 MED ORDER — DIPHENHYDRAMINE HCL 50 MG/ML IJ SOLN
INTRAMUSCULAR | Status: DC | PRN
  Administered 2019-05-27: 10 mg via INTRAVENOUS

## 2019-05-27 MED ORDER — ONDANSETRON HCL 4 MG/2ML IJ SOLN
INTRAMUSCULAR | Status: DC | PRN
  Administered 2019-05-27: 4 mg via INTRAVENOUS

## 2019-05-27 MED ORDER — FENTANYL CITRATE (PF) 100 MCG/2ML IJ SOLN
INTRAMUSCULAR | Status: AC
  Administered 2019-05-27: 17:00:00 100 ug via INTRAVENOUS
  Filled 2019-05-27: qty 2

## 2019-05-27 MED ORDER — OXYCODONE HCL 5 MG PO TABS: 5.0000 mg | ORAL_TABLET | Freq: Once | ORAL | Status: DC | PRN

## 2019-05-27 MED ORDER — MIDAZOLAM HCL 2 MG/2ML IJ SOLN
INTRAMUSCULAR | Status: DC | PRN
  Administered 2019-05-27: 2 mg via INTRAVENOUS

## 2019-05-27 MED ORDER — PROPOFOL 1000 MG/100ML IV EMUL
INTRAVENOUS | Status: AC
  Filled 2019-05-27: qty 200

## 2019-05-27 MED ORDER — MIDAZOLAM HCL 2 MG/2ML IJ SOLN
2.0000 mg | Freq: Once | INTRAMUSCULAR | Status: AC
  Administered 2019-05-27: 17:00:00 2 mg via INTRAVENOUS

## 2019-05-27 MED ORDER — PROPOFOL 10 MG/ML IV BOLUS
INTRAVENOUS | Status: DC | PRN
  Administered 2019-05-27: 20 mg via INTRAVENOUS

## 2019-05-27 MED ORDER — MIDAZOLAM HCL 2 MG/2ML IJ SOLN
INTRAMUSCULAR | Status: AC
  Filled 2019-05-27: qty 2

## 2019-05-27 MED ORDER — MEPERIDINE HCL 25 MG/ML IJ SOLN: 6.2500 mg | INTRAMUSCULAR | Status: DC | PRN

## 2019-05-27 SURGICAL SUPPLY — 59 items
BNDG COHESIVE 1X5 TAN STRL LF (GAUZE/BANDAGES/DRESSINGS) IMPLANT
BNDG CONFORM 2 STRL LF (GAUZE/BANDAGES/DRESSINGS) ×3 IMPLANT
BNDG ELASTIC 2X5.8 VLCR STR LF (GAUZE/BANDAGES/DRESSINGS) ×3 IMPLANT
BNDG ELASTIC 3X5.8 VLCR STR LF (GAUZE/BANDAGES/DRESSINGS) ×3 IMPLANT
BNDG ELASTIC 4X5.8 VLCR STR LF (GAUZE/BANDAGES/DRESSINGS) IMPLANT
BNDG GAUZE ELAST 4 BULKY (GAUZE/BANDAGES/DRESSINGS) ×3 IMPLANT
CORD BIPOLAR FORCEPS 12FT (ELECTRODE) ×3 IMPLANT
COVER SURGICAL LIGHT HANDLE (MISCELLANEOUS) ×3 IMPLANT
COVER WAND RF STERILE (DRAPES) ×3 IMPLANT
CUFF TOURN SGL QUICK 18X4 (TOURNIQUET CUFF) ×3 IMPLANT
CUFF TOURN SGL QUICK 24 (TOURNIQUET CUFF)
CUFF TRNQT CYL 24X4X16.5-23 (TOURNIQUET CUFF) IMPLANT
DECANTER SPIKE VIAL GLASS SM (MISCELLANEOUS) ×3 IMPLANT
DRAPE SURG 17X23 STRL (DRAPES) ×3 IMPLANT
DRSG EMULSION OIL 3X3 NADH (GAUZE/BANDAGES/DRESSINGS) ×9 IMPLANT
DRSG XEROFORM 1X8 (GAUZE/BANDAGES/DRESSINGS) ×9 IMPLANT
GAUZE SPONGE 2X2 8PLY STRL LF (GAUZE/BANDAGES/DRESSINGS) IMPLANT
GAUZE SPONGE 4X4 12PLY STRL (GAUZE/BANDAGES/DRESSINGS) IMPLANT
GAUZE XEROFORM 1X8 LF (GAUZE/BANDAGES/DRESSINGS) IMPLANT
GLOVE BIOGEL M 8.0 STRL (GLOVE) ×3 IMPLANT
GLOVE SS BIOGEL STRL SZ 8 (GLOVE) ×1 IMPLANT
GLOVE SUPERSENSE BIOGEL SZ 8 (GLOVE) ×2
GOWN STRL REUS W/ TWL LRG LVL3 (GOWN DISPOSABLE) ×2 IMPLANT
GOWN STRL REUS W/ TWL XL LVL3 (GOWN DISPOSABLE) ×3 IMPLANT
GOWN STRL REUS W/TWL LRG LVL3 (GOWN DISPOSABLE) ×4
GOWN STRL REUS W/TWL XL LVL3 (GOWN DISPOSABLE) ×6
GUIDE NERVE NEURAGEN 1.5MM (Tissue) ×3 IMPLANT
KIT BASIN OR (CUSTOM PROCEDURE TRAY) ×3 IMPLANT
KIT TURNOVER KIT B (KITS) ×3 IMPLANT
LOOP VESSEL MAXI BLUE (MISCELLANEOUS) IMPLANT
MANIFOLD NEPTUNE II (INSTRUMENTS) ×3 IMPLANT
NEEDLE HYPO 25GX1X1/2 BEV (NEEDLE) IMPLANT
NS IRRIG 1000ML POUR BTL (IV SOLUTION) ×3 IMPLANT
PACK ORTHO EXTREMITY (CUSTOM PROCEDURE TRAY) ×3 IMPLANT
PAD ARMBOARD 7.5X6 YLW CONV (MISCELLANEOUS) ×6 IMPLANT
PAD CAST 3X4 CTTN HI CHSV (CAST SUPPLIES) ×1 IMPLANT
PAD CAST 4YDX4 CTTN HI CHSV (CAST SUPPLIES) IMPLANT
PADDING CAST COTTON 3X4 STRL (CAST SUPPLIES) ×2
PADDING CAST COTTON 4X4 STRL (CAST SUPPLIES)
SET CYSTO W/LG BORE CLAMP LF (SET/KITS/TRAYS/PACK) ×3 IMPLANT
SOL PREP POV-IOD 4OZ 10% (MISCELLANEOUS) ×12 IMPLANT
SPEAR EYE SURG WECK-CEL (MISCELLANEOUS) IMPLANT
SPECIMEN JAR SMALL (MISCELLANEOUS) ×3 IMPLANT
SPLINT FIBERGLASS 3X12 (CAST SUPPLIES) ×3 IMPLANT
SPONGE GAUZE 2X2 STER 10/PKG (GAUZE/BANDAGES/DRESSINGS)
STOCKINETTE TUBULAR SYNTH 4IN (CAST SUPPLIES) ×3 IMPLANT
SUT CHROMIC 5 0 P 3 (SUTURE) ×9 IMPLANT
SUT MERSILENE 4 0 P 3 (SUTURE) IMPLANT
SUT PROLENE 4 0 PS 2 18 (SUTURE) IMPLANT
SUT PROLENE 5 0 P 3 (SUTURE) ×9 IMPLANT
SUT VIC AB 2-0 CT1 27 (SUTURE)
SUT VIC AB 2-0 CT1 TAPERPNT 27 (SUTURE) IMPLANT
SYR CONTROL 10ML LL (SYRINGE) IMPLANT
TOWEL GREEN STERILE (TOWEL DISPOSABLE) ×3 IMPLANT
TOWEL GREEN STERILE FF (TOWEL DISPOSABLE) ×3 IMPLANT
TUBE CONNECTING 12'X1/4 (SUCTIONS)
TUBE CONNECTING 12X1/4 (SUCTIONS) IMPLANT
UNDERPAD 30X30 (UNDERPADS AND DIAPERS) ×3 IMPLANT
WATER STERILE IRR 1000ML POUR (IV SOLUTION) ×3 IMPLANT

## 2019-05-27 NOTE — Transfer of Care (Signed)
Immediate Anesthesia Transfer of Care Note  Patient: Scott Solis  Procedure(s) Performed: Exploration and repair tendon, nerve, artery and associated structures with flap advancement right middle, ring and small fingers (Right Hand)  Patient Location: PACU  Anesthesia Type:MAC and Regional  Level of Consciousness: awake, alert , oriented and patient cooperative  Airway & Oxygen Therapy: Patient Spontanous Breathing  Post-op Assessment: Report given to RN and Post -op Vital signs reviewed and stable  Post vital signs: Reviewed and stable  Last Vitals:  Vitals Value Taken Time  BP    Temp    Pulse 81 05/27/19 2000  Resp    SpO2 94 % 05/27/19 2000  Vitals shown include unvalidated device data.  Last Pain:  Vitals:   05/27/19 1422  TempSrc:   PainSc: 5       Patients Stated Pain Goal: 0 (31/43/88 8757)  Complications: No apparent anesthesia complications

## 2019-05-27 NOTE — Anesthesia Procedure Notes (Signed)
Anesthesia Regional Block: Axillary brachial plexus block   Pre-Anesthetic Checklist: ,, timeout performed, Correct Patient, Correct Site, Correct Laterality, Correct Procedure, Correct Position, site marked, Risks and benefits discussed,  Surgical consent,  Pre-op evaluation,  At surgeon's request and post-op pain management  Laterality: Right  Prep: chloraprep       Needles:  Injection technique: Single-shot  Needle Type: Stimiplex          Additional Needles:   Procedures:,,,, ultrasound used (permanent image in chart),,,,  Narrative:  Start time: 05/27/2019 4:38 PM End time: 05/27/2019 4:45 PM  Performed by: Personally  Anesthesiologist: Nolon Nations, MD  Additional Notes: Patient tolerated the procedure well without complications

## 2019-05-27 NOTE — Anesthesia Postprocedure Evaluation (Signed)
Anesthesia Post Note  Patient: Scott Solis  Procedure(s) Performed: Exploration and repair tendon, nerve, artery and associated structures with flap advancement right middle, ring and small fingers (Right Hand)     Patient location during evaluation: PACU Anesthesia Type: Regional Level of consciousness: awake and alert Pain management: pain level controlled Vital Signs Assessment: post-procedure vital signs reviewed and stable Respiratory status: spontaneous breathing, nonlabored ventilation and respiratory function stable Cardiovascular status: stable and blood pressure returned to baseline Postop Assessment: no apparent nausea or vomiting Anesthetic complications: no    Last Vitals:  Vitals:   05/27/19 2036 05/27/19 2045  BP: 127/74 127/74  Pulse: 69 65  Resp: 19 18  Temp: (!) 36.1 C (!) 36.1 C  SpO2: 97% 97%    Last Pain:  Vitals:   05/27/19 2045  TempSrc:   PainSc: 0-No pain                 Catalina Gravel

## 2019-05-27 NOTE — Op Note (Signed)
Operative note 05/27/2019  Scott SeverinWilliam Vian Fluegel MD  Preoperative diagnosis middle ring and small finger multiple lacerations with numbness about the ulnar aspect of the middle finger and radial aspect of the ring finger.  Decreased range of motion and pain notable.  Postop diagnosis radial digital nerve injury to the ring finger, ulnar digital nerve injury to the middle finger, extensor sheath injury to the ring and small finger.  Multiple lacerations secondary to a blender injury.  Operative procedure #1 irrigation debridement skin subcutaneous tissue tendon and bone right middle finger secondary to open fracture about the PIP region #2 open treatment PIP fracture right middle finger #3 ulnar digital nerve repair right middle finger #4 complex laceration repair right middle finger #5 right ring finger radial digital nerve repair with collagen wrap and direct primary repair #6 flexor tendon tenolysis tenosynovectomy right ring finger #7 complex laceration repair right ring finger #8 right small finger tenolysis tenosynovectomy of the FDP tendon with tendon sheath injury #9 complex closure laceration right small finger  Surgeon Scott SeverinWilliam Kamilla Solis  Anesthesia block with IV sedation  Estimated blood loss minimal  Operative procedure detail patient was seen by myself and anesthesia taken to the operative theater and underwent a smooth induction of IV sedation Hibiclens pre-scrub was then applied.  I then performed a Betadine scrub and paint myself.  Following this we then performed very careful and cautious approach to the extremity with irrigation and debridement of the right small finger skin subtenons tissue tendon sheath and tendon.  A tenolysis tenosynovectomy along a 4 cm segment or so was accomplished as this was a vertical wound midline.  We irrigated aggressively and cleaned out the tendon sheath as well as fraying about the tendon.  The digital nerves were intact about the small finger.  Following  this attention was turned towards the ring finger irrigation debridement of skin subtenons tissue and tendon was accomplished.  The patient then had the tendon treated with tenolysis tenosynovectomy as there was fraying and mild injury.  The FDP was intact throughout its extent however.  Following this we coapted the radial digital nerve with 8-0 nylon and placed the collagen nerve wrap 1.5 mm around it.  This was a direct primary repair and collagen nerve wrap radial digital nerve to the right ring finger.  Following this we performed a complex repair of a 4 cm laceration jagged nature with island vascularity secondary to the complex blender injury with combination 5-0 Prolene and 5-0 chromic  Following this we repaired the middle finger with irrigation debridement skin subtenons tissue and bone following this the flexor sheath was noted to be intact and the open PIP joint was treated with open technique.  The bone fragments were removed as necessary and stability was afforded by the bony contours and no stabilization efforts were needed.  Following this I performed a ulnar digital nerve repair about the patient's middle finger followed by collagen nerve wrap.  8-0 nylon was used for this and there were no complicating features.  Following this complex wound closure was accomplished.  The patient had some additional dorsal lacerations which were repaired as well there were no complications or issues.  I should note that 4 L of saline were used for the irrigation purpose and tourniquet time was less than an hour.  The patient tolerated this well.  Moving forward we will get him moving it 10 to 14 days aggressively in therapy.  We will monitor his skin flaps closely and make sure  things go smoothly.  Should any problems occur the patient will notify me otherwise look forward to seeing him back in the office at that juncture.  All questions have been encouraged and answered.  Standard dressing and  splint were applied to my satisfaction.  Scott Ihnen MD

## 2019-05-27 NOTE — Discharge Instructions (Signed)
Regional Anesthesia Blocks  1. Numbness or the inability to move the "blocked" extremity may last from 3-48 hours after placement. The length of time depends on the medication injected and your individual response to the medication. If the numbness is not going away after 48 hours, call your surgeon.  2. The extremity that is blocked will need to be protected until the numbness is gone and the  Strength has returned. Because you cannot feel it, you will need to take extra care to avoid injury. Because it may be weak, you may have difficulty moving it or using it. You may not know what position it is in without looking at it while the block is in effect.  3. For blocks in the legs and feet, returning to weight bearing and walking needs to be done carefully. You will need to wait until the numbness is entirely gone and the strength has returned. You should be able to move your leg and foot normally before you try and bear weight or walk. You will need someone to be with you when you first try to ensure you do not fall and possibly risk injury.  4. Bruising and tenderness at the needle site are common side effects and will resolve in a few days.  5. Persistent numbness or new problems with movement should be communicated to the surgeon or the [facility] [phone] [place] [phone]).Please keep your bandage clean and dry.  You are okay to try and move your fingers gently within the bandage as pain allows  If you have any problems Dr. Amedeo Plenty cellular phone is [phone].  You may call this number if you have emergencies.  For any general questions call the office at [phone]  We recommend that you to take vitamin C 1000 mg a day to promote healing. We also recommend that if you require  pain medicine that you take a stool softener to prevent constipation as most pain medicines will have constipation side effects. We recommend either Peri-Colace or Senokot and  recommend that you also consider adding MiraLAX as well to prevent the constipation affects from pain medicine if you are required to use them. These medicines are over the counter and may be purchased at a local pharmacy. A cup of yogurt and a probiotic can also be helpful during the recovery process as the medicines can disrupt your intestinal environment. Keep bandage clean and dry.  Call for any problems.  No smoking.  Criteria for driving a car: you should be off your pain medicine for 7-8 hours, able to drive one handed(confident), thinking clearly and feeling able in your judgement to drive. Continue elevation as it will decrease swelling. Call immediately for any sudden loss of feeling in your hand/arm or change in functional abilities of the extremity.

## 2019-05-27 NOTE — Anesthesia Preprocedure Evaluation (Signed)
Anesthesia Evaluation  Patient identified by MRN, date of birth, ID band Patient awake    Reviewed: Allergy & Precautions, NPO status , Patient's Chart, lab work & pertinent test results  Airway Mallampati: II  TM Distance: >3 FB Neck ROM: Full    Dental  (+) Dental Advisory Given, Teeth Intact   Pulmonary neg pulmonary ROS,    Pulmonary exam normal breath sounds clear to auscultation       Cardiovascular negative cardio ROS Normal cardiovascular exam Rhythm:Regular Rate:Normal     Neuro/Psych  Headaches, Double vision negative psych ROS   GI/Hepatic negative GI ROS, Neg liver ROS,   Endo/Other  Morbid obesity  Renal/GU negative Renal ROS     Musculoskeletal negative musculoskeletal ROS (+)   Abdominal (+) + obese,   Peds  Hematology HLD   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   Reproductive/Obstetrics                            Anesthesia Physical  Anesthesia Plan  ASA: III  Anesthesia Plan: Regional   Post-op Pain Management:    Induction: Intravenous  PONV Risk Score and Plan: 2 and Ondansetron, Propofol infusion and Treatment may vary due to age or medical condition  Airway Management Planned:   Additional Equipment:   Intra-op Plan:   Post-operative Plan:   Informed Consent: I have reviewed the patients History and Physical, chart, labs and discussed the procedure including the risks, benefits and alternatives for the proposed anesthesia with the patient or authorized representative who has indicated his/her understanding and acceptance.     Dental advisory given  Plan Discussed with: CRNA  Anesthesia Plan Comments:        Anesthesia Quick Evaluation

## 2019-05-31 ENCOUNTER — Encounter (HOSPITAL_COMMUNITY): Payer: Self-pay | Admitting: Orthopedic Surgery

## 2019-10-16 IMAGING — US IR TRANSCATH EMBOLIZATION
1 series · 2 of 2 positions shown · non-contrast
Comparison: none

PROCEDURE:
DIAGNOSTIC CEREBRAL ANGIOGRAM

COIL EMBOLIZATION OF LEFT CAROTID-CAVERNOUS FISTULA
HISTORY: The patient is a 57-year-old man who initially presented with
left-sided abducens palsy, ptosis, and retro-orbital pain. Initial
diagnostic cerebral angiogram demonstrated the presence of an
indirect left carotid cavernous fistula. He therefore presents today
for endovascular transvenous coil embolization of the left cavernous
sinus.
TECHNIQUE: CATHETERS AND WIRES
5-Anabela Silva Jhon Zico 2 glide catheter

[Series 1: ir transcath embolization · 2 of 2 slices shown]
[im 1/2]
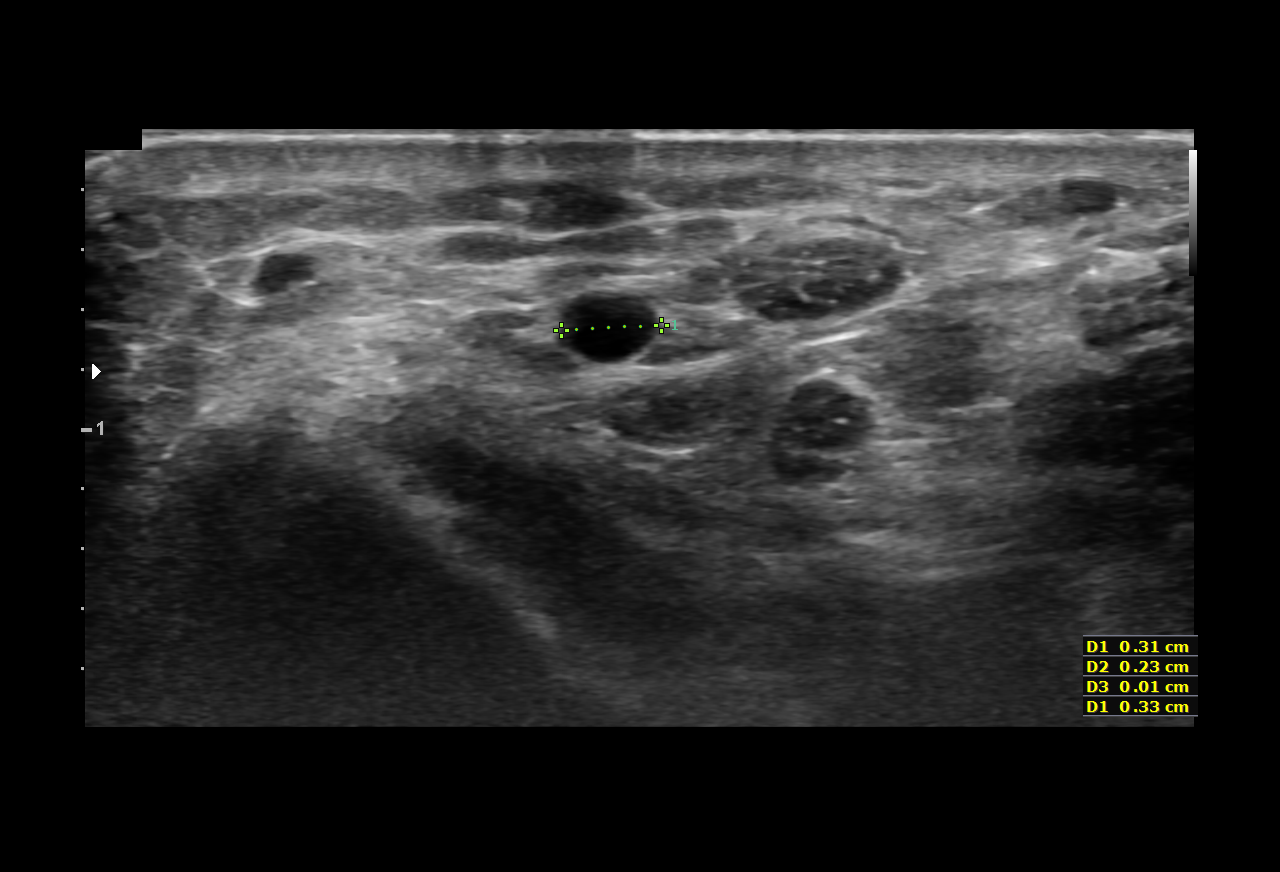
[im 2/2]
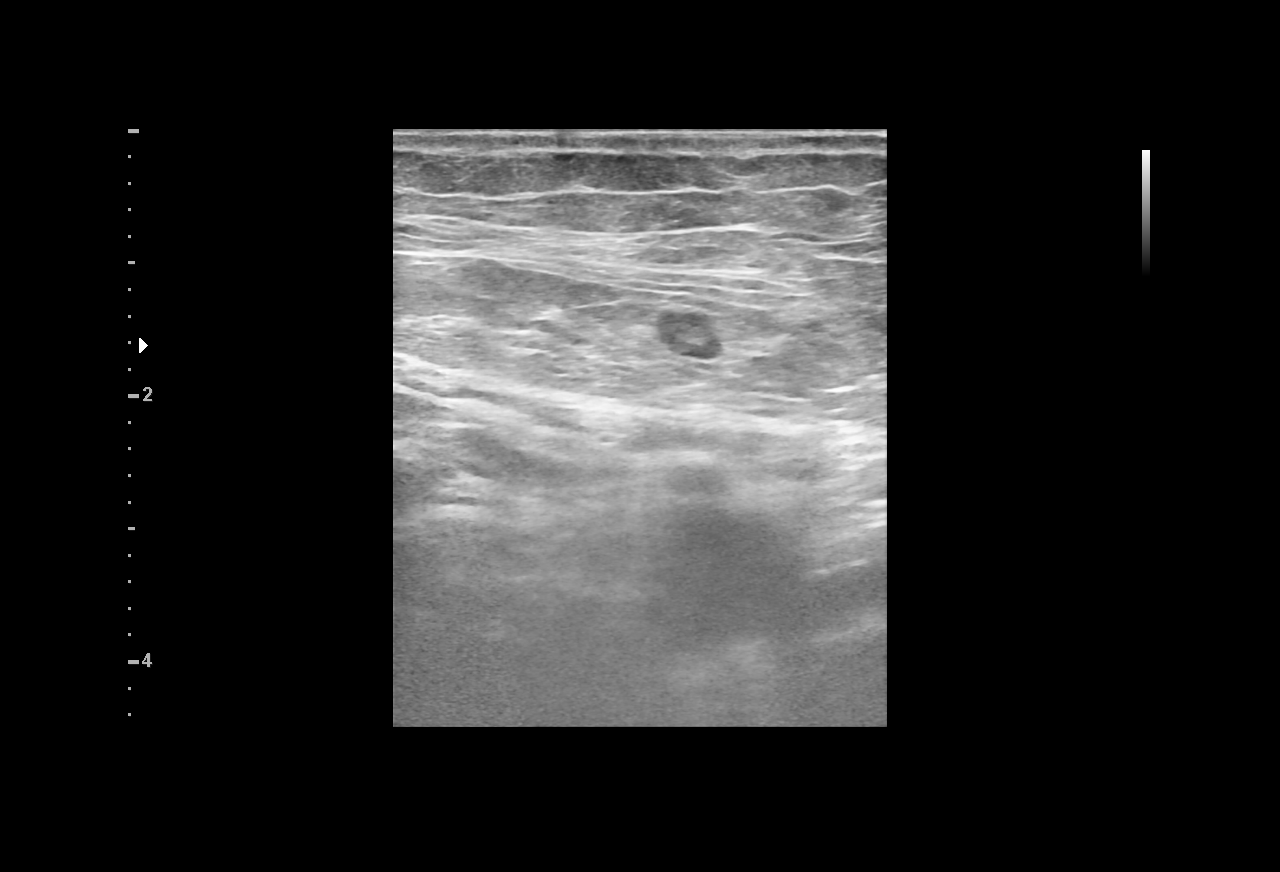

[2 of 2 positions shown; findings below may reference images not displayed]

ACCESS:
The technical aspects of the procedure as well as its potential
risks and benefits were reviewed with the patient's family. These
risks included but were not limited to stroke, intracranial
hemorrhage, bleeding, infection, allergic reaction, damage to organs
or vital structures, stroke, non-diagnostic procedure, and the
catastrophic outcomes of heart attack, coma, and death. With an
understanding of these risks, informed consent was obtained and
witnessed. The patient was placed in the supine position on the
angiography table and the skin of right groin prepped in the usual
sterile fashion. The procedure was performed under general
anesthesia.

Using ultrasound guidance, the right radial artery was identified.
Subcutaneous injection of nitroglycerin and lidocaine was
administered. Micropuncture needle was then introduced into the
right radial artery using ultrasound guidance. A 5-French Terumo
glide-slender sheath was introduced in the right radial artery using
Seldinger technique. Nitroglycerin and verapamil were then injected
through the sheath. Fluoro phase sequence was used to delineate
location of the sheath and the radial anatomy.

Using ultrasound guidance, the right common femoral artery and vein
were identified. Micropuncture needle was then used to access the
right femoral vein using the ultrasound. Five French glide sheath
was introduced using Seldinger technique. This was then connected to
continuous heparinized saline flush.

MEDICATIONS:
HEPARIN: 3888 Units total.

Nitroglycerin: 500 micrograms

Verapamil: 2.5mg

CONTRAST:  cc, Omnipaque 300

FLUOROSCOPY TIME:  FLUOROSCOPY TIME: 61.6 combined AP and lateral
minutes
180 cm 0.035" glidewire

180 cm 0.035 inch shapeable Glidewire

Five French 90cm MPD guide catheter

Excelsior G1-43 microcatheter

Pathom-5G microwire

3ynchro-1 STD microwire

3ynchro-1 Support microwire

COILS USED
Target XL 360 soft 12 mm x 45 cm

Target XL 360 soft 10 mm x 40 cm

Axium 9 mm x 30 cm

Target XL 360 soft 9 mm x 30 cm

Axium 7 mm x 30 cm

Target 360 soft 7 mm x 30 cm (not deployed)

VESSELS CATHETERIZED
Right common carotid

Left external carotid

Left common carotid

Left cavernous sinus

Right radial artery

VESSELS STUDIED
Left external carotid artery, head (during embolization)

Left external carotid artery, head (post-embolization)

Left common carotid artery, head (post embolization )

Right common carotid artery, head (post embolization )

Right radial artery

PROCEDURAL NARRATIVE
The 5 Anabela Silva Jhon Zico 2 glide catheter was advanced over the
Glidewire. Secondary curve was reformed on the aortic valve. The
left common carotid artery followed by selective catheterization of
the left external carotid artery was achieved.

The MPD guide catheter was introduced over the Glidewire through the
right femoral vein. Attempts were made to catheterize the left
innominate vein unsuccessfully. The Glidewire was therefore removed
and shapeable Glidewire was introduced. This allowed easy
catheterization, and the MPD catheter was advanced through the left
jugular vein to the level of the jugular bulb. The G1-43
microcatheter was then introduced over the synchro 2 standard
microwire. Initially, attempt was made to place the fathom microwire
through the XT 17 microcatheter which was unsuccessful.

Under roadmap guidance, the microcatheter was advanced over the
microwire through the inferior petrosal sinus into the cavernous
sinus. Microcatheter angiogram in the cavernous sinus was taken. At
this point, the above coils were sequentially placed in the left
cavernous sinus. Angiograms were taken during embolization through
the glide catheter within the left external carotid artery.

Once the cavernous sinus was occluded, the glide catheter was
withdrawn into the distal left common carotid artery and angiogram
was taken. Catheter was then navigated into the right common carotid
artery and angiogram taken. The glide catheter was then removed
without incident. The microcatheter was then removed from the
cavernous sinus, and the MPD guide catheter was removed without
incident.
FINDINGS: Left cavernous sinus:

Microcatheter injection taken with the tip in the left cavernous
sinus demonstrates retrograde filling of the large temporal cortical
vein. The superior ophthalmic vein is not visualized.

Left external carotid, pre embolization:

Left external carotid injection again demonstrates brisk filling of
the left cavernous carotid fistula primarily through multiple
racemose branches of the ascending pharyngeal artery. There is
drainage from the cavernous sinus retrograde through the large
temporal cortical vein. This is largely stable in comparison to the
prior angiogram.

Left external carotid, during embolization:

Angiograms taken from the left external carotid artery during coil
embolization of the cavernous sinus demonstrate progressive
exclusion of the left cavernous sinus. Some coil prolapse into the
origin of the superior ophthalmic vein is identified. Subsequent
angiograms during coil embolization begin to opacify the distal
portion of the left superior ophthalmic vein. No contrast
extravasation is seen.

Left external carotid, post embolization:

Angiogram taken after embolization of the cavernous sinus
demonstrates no further filling of the cavernous carotid fistula.
Cavernous sinus is no longer filling. The retrograde filling of left
temporal cortical vein is also no longer identified. The remainder
of the visualized cranial branches of the left external carotid
artery are unremarkable.

Left common carotid, post embolization:

Injection demonstrates widely patent left internal carotid artery,
with normal ICA bifurcation. The distal branches of the ACA and MCA
are unremarkable. Previously identified supply to the left CC
fistula from the anterior meningeal branches from the ophthalmic
artery are no longer seen. There is no early opacification of the
cavernous sinus, nor is there opacification of the previously seen
temporal draining vein. Capillary phase is unremarkable. The
superior sagittal sinus, transverse sigmoid sinus, and proximal
jugular vein remain widely patent.

Right common carotid, post embolization:

Injection demonstrates widely patent right internal carotid artery,
with normal carotid bifurcation. Distal branches of the ACA and MCA
are normal. Previously seen supply to the left carotid cavernous
fistula from the right ophthalmic artery and the right ascending
pharyngeal artery is no longer seen. There is no further filling of
the left cavernous sinus from this right-sided injection.

Right radial artery:

Arterial sheath is in adequate position in the right radial artery.
There is no atherosclerotic disease or vasospasm identified.

DISPOSITION:
Upon completion of the study, the venous femoral sheath was removed
and hemostasis obtained by manual compression. Patent hemostasis was
achieved on the right radial artery with the Terumo TR band. The
procedure was well tolerated and no early complications were
observed.

The patient was transferred to the postanesthesia care unit in
stable hemodynamic and neurologic condition..
IMPRESSION: 1. Successful coil embolization of the left cavernous sinus with
complete occlusion of the previously seen indirect left carotid
cavernous fistula.

The preliminary results of this procedure were shared with the
patient and the patient's family.
